# Patient Record
Sex: Female | Born: 1965 | Race: White | Hispanic: No | Marital: Single | Smoking: Former smoker
Health system: Southern US, Community
[De-identification: ages and names within clinical notes are randomized; demographics above are authoritative.]

## PROBLEM LIST (undated history)

## (undated) DIAGNOSIS — Z8659 Personal history of other mental and behavioral disorders: Secondary | ICD-10-CM

## (undated) DIAGNOSIS — Z789 Other specified health status: Secondary | ICD-10-CM

## (undated) DIAGNOSIS — R059 Cough, unspecified: Secondary | ICD-10-CM

## (undated) DIAGNOSIS — M79672 Pain in left foot: Secondary | ICD-10-CM

## (undated) DIAGNOSIS — K219 Gastro-esophageal reflux disease without esophagitis: Secondary | ICD-10-CM

## (undated) DIAGNOSIS — F419 Anxiety disorder, unspecified: Secondary | ICD-10-CM

## (undated) DIAGNOSIS — R05 Cough: Secondary | ICD-10-CM

## (undated) DIAGNOSIS — R0600 Dyspnea, unspecified: Secondary | ICD-10-CM

## (undated) DIAGNOSIS — K2100 Gastro-esophageal reflux disease with esophagitis, without bleeding: Secondary | ICD-10-CM

## (undated) DIAGNOSIS — J45909 Unspecified asthma, uncomplicated: Secondary | ICD-10-CM

## (undated) DIAGNOSIS — L309 Dermatitis, unspecified: Secondary | ICD-10-CM

## (undated) DIAGNOSIS — R0689 Other abnormalities of breathing: Secondary | ICD-10-CM

## (undated) DIAGNOSIS — Z8719 Personal history of other diseases of the digestive system: Secondary | ICD-10-CM

## (undated) DIAGNOSIS — I499 Cardiac arrhythmia, unspecified: Secondary | ICD-10-CM

## (undated) DIAGNOSIS — E559 Vitamin D deficiency, unspecified: Secondary | ICD-10-CM

## (undated) DIAGNOSIS — M1990 Unspecified osteoarthritis, unspecified site: Secondary | ICD-10-CM

## (undated) DIAGNOSIS — R12 Heartburn: Secondary | ICD-10-CM

## (undated) DIAGNOSIS — Z889 Allergy status to unspecified drugs, medicaments and biological substances status: Secondary | ICD-10-CM

## (undated) DIAGNOSIS — E039 Hypothyroidism, unspecified: Secondary | ICD-10-CM

## (undated) HISTORY — PX: ABLATION: SHX5711

## (undated) HISTORY — PX: BACK SURGERY: SHX140

## (undated) HISTORY — PX: TONSILLECTOMY: SUR1361

## (undated) HISTORY — PX: OTHER SURGICAL HISTORY: SHX169

## (undated) HISTORY — PX: TUBAL LIGATION: SHX77

---

## 2012-09-04 ENCOUNTER — Emergency Department: Payer: Self-pay | Admitting: Emergency Medicine

## 2012-09-04 LAB — URINALYSIS, COMPLETE
Bilirubin,UR: NEGATIVE
Glucose,UR: NEGATIVE mg/dL (ref 0–75)
Leukocyte Esterase: NEGATIVE
Nitrite: NEGATIVE
Ph: 5 (ref 4.5–8.0)
Protein: NEGATIVE

## 2012-09-30 ENCOUNTER — Emergency Department: Payer: Self-pay | Admitting: Emergency Medicine

## 2012-09-30 LAB — URINALYSIS, COMPLETE
Bilirubin,UR: NEGATIVE
Glucose,UR: NEGATIVE mg/dL (ref 0–75)
Ketone: NEGATIVE
Leukocyte Esterase: NEGATIVE
Ph: 6 (ref 4.5–8.0)
Protein: NEGATIVE
RBC,UR: <1 /HPF (ref 0–5)
Specific Gravity: 1.011 (ref 1.003–1.030)

## 2012-09-30 LAB — CBC
HCT: 37.3 % (ref 35.0–47.0)
HGB: 12.5 g/dL (ref 12.0–16.0)
MCH: 28.8 pg (ref 26.0–34.0)
MCHC: 33.6 g/dL (ref 32.0–36.0)
MCV: 86 fL (ref 80–100)
RBC: 4.35 10*6/uL (ref 3.80–5.20)
WBC: 7.8 10*3/uL (ref 3.6–11.0)

## 2012-09-30 LAB — COMPREHENSIVE METABOLIC PANEL
Albumin: 3.5 g/dL (ref 3.4–5.0)
Anion Gap: 9 (ref 7–16)
BUN: 8 mg/dL (ref 7–18)
Calcium, Total: 8.8 mg/dL (ref 8.5–10.1)
Chloride: 108 mmol/L — ABNORMAL HIGH (ref 98–107)
Co2: 23 mmol/L (ref 21–32)
EGFR (African American): >60
EGFR (Non-African Amer.): >60
Glucose: 89 mg/dL (ref 65–99)
Osmolality: 277 (ref 275–301)
Potassium: 4 mmol/L (ref 3.5–5.1)
SGOT(AST): 16 U/L (ref 15–37)
Sodium: 140 mmol/L (ref 136–145)
Total Protein: 7.5 g/dL (ref 6.4–8.2)

## 2012-09-30 LAB — LIPASE, BLOOD: Lipase: 68 U/L — ABNORMAL LOW (ref 73–393)

## 2013-05-02 ENCOUNTER — Emergency Department: Payer: Self-pay | Admitting: Emergency Medicine

## 2013-05-02 LAB — URINALYSIS, COMPLETE
Bacteria: NONE SEEN
Blood: NEGATIVE
Ketone: NEGATIVE
Ph: 5 (ref 4.5–8.0)
Specific Gravity: 1.026 (ref 1.003–1.030)

## 2013-05-02 LAB — COMPREHENSIVE METABOLIC PANEL
Albumin: 3.4 g/dL (ref 3.4–5.0)
Alkaline Phosphatase: 75 U/L (ref 50–136)
Anion Gap: 5 — ABNORMAL LOW (ref 7–16)
BUN: 9 mg/dL (ref 7–18)
Bilirubin,Total: 0.3 mg/dL (ref 0.2–1.0)
Chloride: 108 mmol/L — ABNORMAL HIGH (ref 98–107)
Co2: 27 mmol/L (ref 21–32)
EGFR (Non-African Amer.): >60
Osmolality: 278 (ref 275–301)
SGPT (ALT): 17 U/L (ref 12–78)
Sodium: 140 mmol/L (ref 136–145)
Total Protein: 7.3 g/dL (ref 6.4–8.2)

## 2013-05-02 LAB — CBC
HGB: 13.2 g/dL (ref 12.0–16.0)
MCHC: 34 g/dL (ref 32.0–36.0)
MCV: 84 fL (ref 80–100)
RBC: 4.6 10*6/uL (ref 3.80–5.20)
WBC: 9.4 10*3/uL (ref 3.6–11.0)

## 2013-05-02 LAB — LIPASE, BLOOD: Lipase: 82 U/L (ref 73–393)

## 2013-05-02 LAB — WET PREP, GENITAL

## 2013-05-03 LAB — GC/CHLAMYDIA PROBE AMP

## 2013-11-09 IMAGING — CT CT ABD-PELV W/O CM
1 of 2 series · 14 of 32 positions shown, 18 images · non-contrast
Comparison: none

REASON FOR EXAM: RUQ/RLQ pain, nausea
COMMENTS:

[Series 2: 3mm soft tissue · axial · 0.89mm/px · z∈[-1066,-614]mm · 14 of 167 slices shown, 18 images]
[im 8/167  soft-tissue]
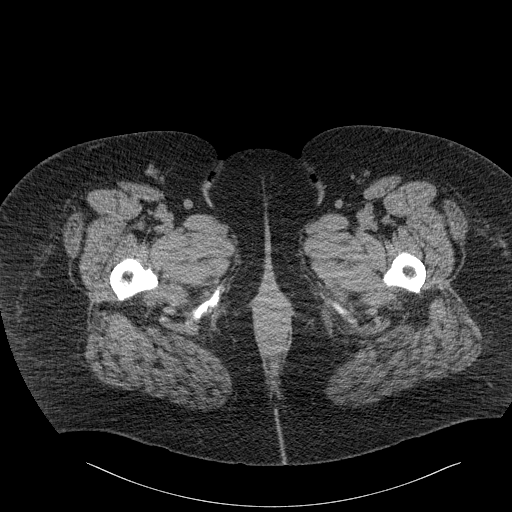
[im 8/167  bone]
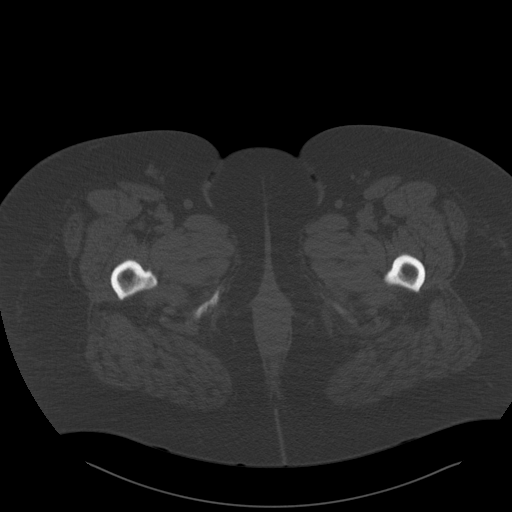
[im 22/167  soft-tissue]
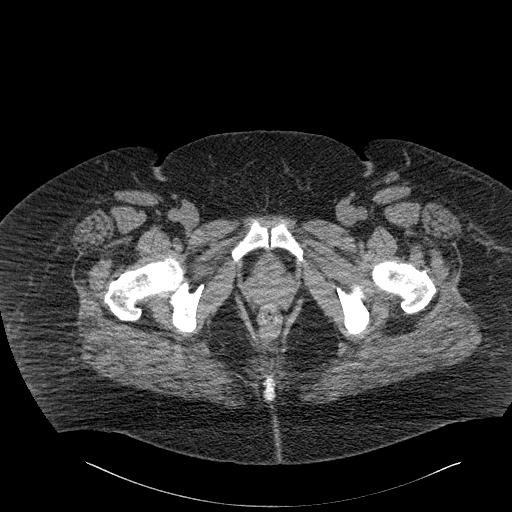
[im 37/167  soft-tissue]
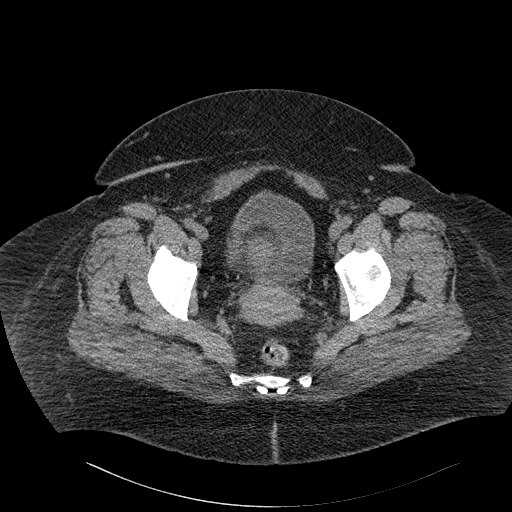
[im 51/167  soft-tissue]
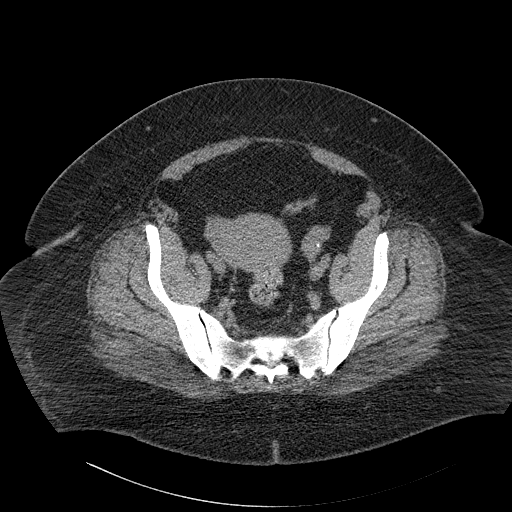
[im 65/167  soft-tissue]
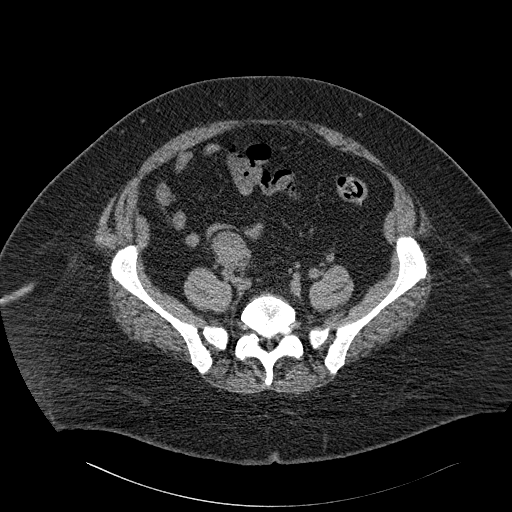
[im 80/167  soft-tissue]
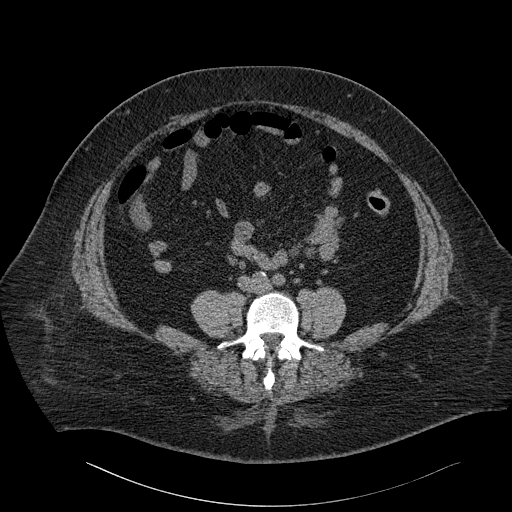
[im 87/167  soft-tissue]
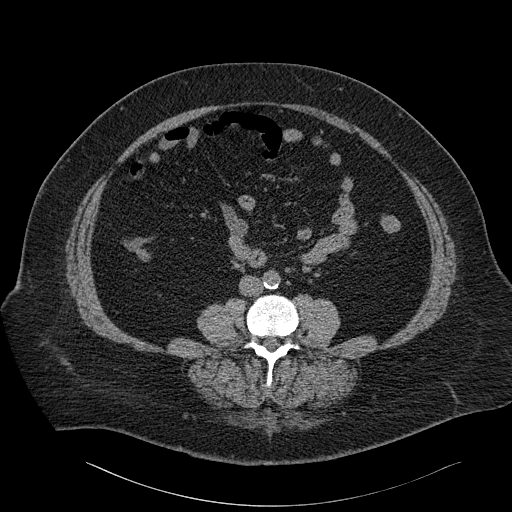
[im 102/167  soft-tissue]
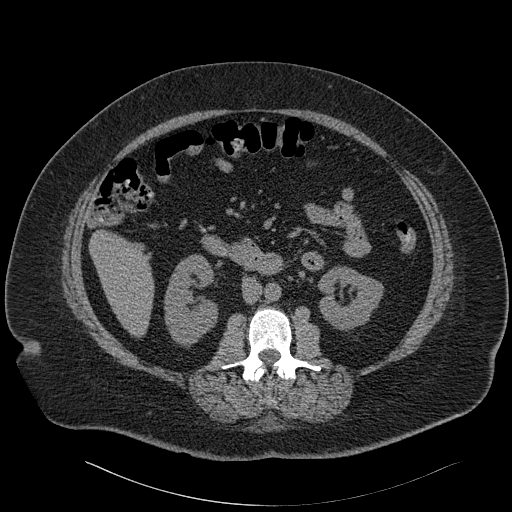
[im 116/167  soft-tissue]
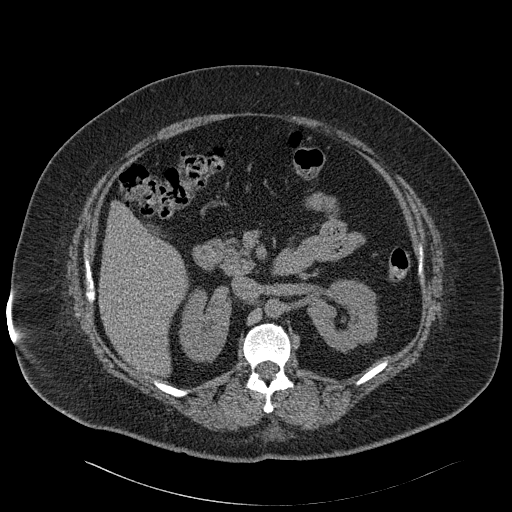
[im 116/167  bone]
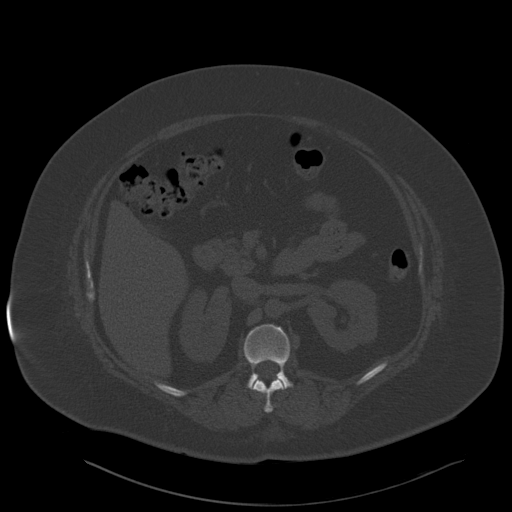
[im 130/167  soft-tissue]
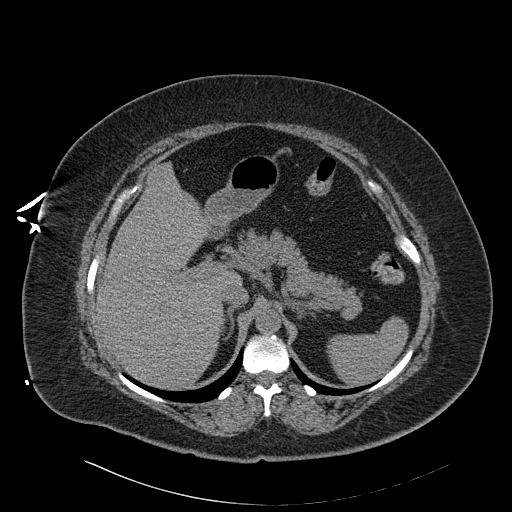
[im 138/167  lung]
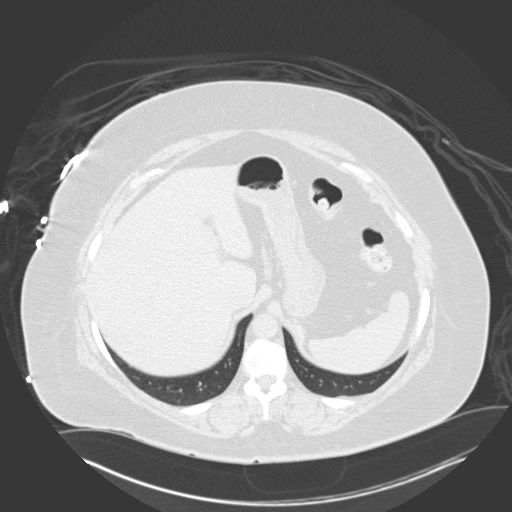
[im 145/167  soft-tissue]
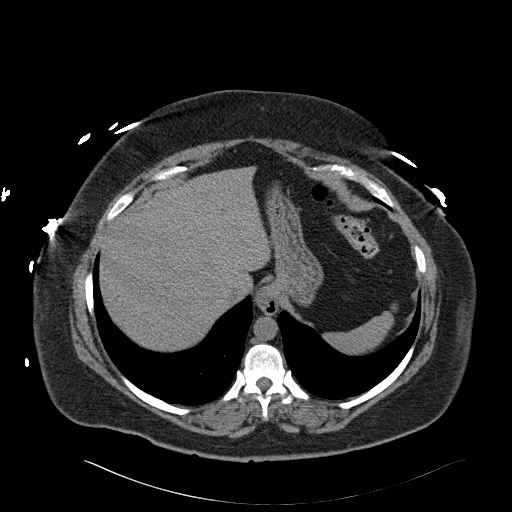
[im 145/167  lung]
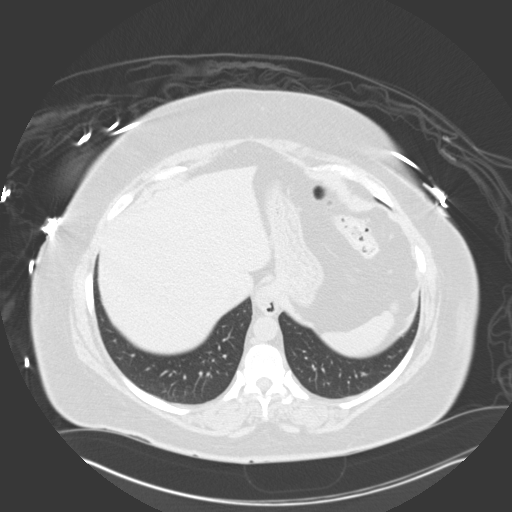
[im 152/167  lung]
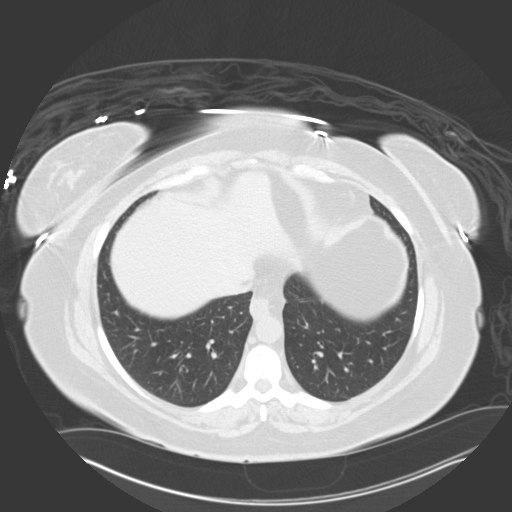
[im 159/167  soft-tissue]
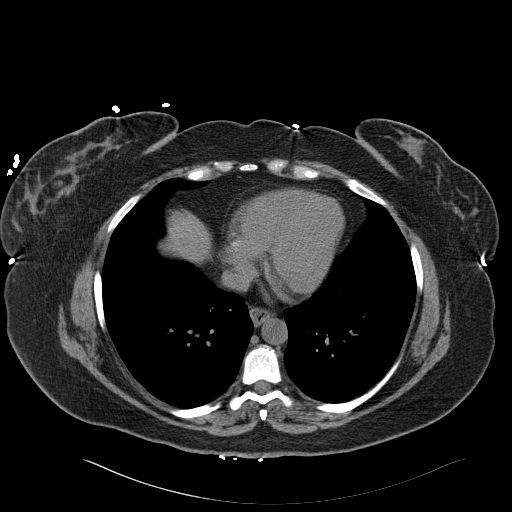
[im 159/167  lung]
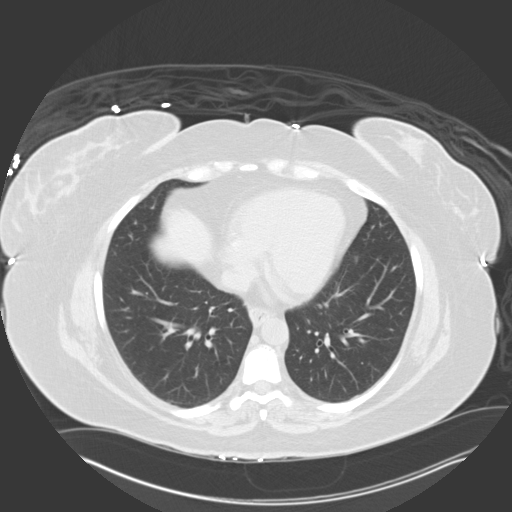

[14 of 32 positions shown; findings below may reference images not displayed]

PROCEDURE:     CT  - CT ABDOMEN AND PELVIS W[DATE]  [DATE]

RESULT:     Axial noncontrast CT scanning was performed through the abdomen
and pelvis with reconstructions at 3 mm intervals and slice thicknesses.
Review of multiplanar reconstructed images was performed separately on the
VIA monitor.

The liver exhibits no focal mass nor ductal dilation. The gallbladder is
adequately distended with no evidence of stones or surrounding inflammatory
change. The pancreas, nondistended stomach, spleen, adrenal glands, and
kidneys exhibit no evident acute abnormalities. There is no evidence of
obstruction of either kidney; no calcified stones are demonstrated. The
caliber of the abdominal aorta is normal.

The unopacified loops of small and large bowel are normal in appearance. The
appendix is not discretely demonstrated. There is no pericecal inflammatory
change nor evidence of acute inflammation elsewhere in the right upper or
lower aspects of the abdomen.

Within the pelvis the uterus is grossly normal. There is soft tissue
fullness in the right adnexal region which measures 5.1 cm transversely by
3.6 cm AP by 4.3 cm in superior-inferior dimension. This structure is
closely applied to the the uterine fundus on the right. It is hypodense with
respect to the density of the uterus however.

There is no evidence of ascites. The psoas musculature appears normal. The
urinary bladder exhibits no acute abnormality. There is no umbilical or
inguinal hernia. The lumbar vertebral bodies are preserved in height. There
is disc space narrowing at L4-L5 and L5-S1. The lung bases are clear.
IMPRESSION: 1. There is no evidence of obstruction of either kidney.
2. There are no findings to suggest acute bowel inflammation. The appendix
is not discretely identified, but no inflammatory process in the right upper
or lower quadrant of the abdomen is demonstrated. Evaluation the bowel is
limited without oral contrast.
3. There is soft tissue fullness in the right adnexal region which merits
further evaluation with gynecological exam and pelvic ultrasound. I do not
see definite acute abnormality of the uterus or left adnexal structures.
4. There is no evidence of ascites. No bulky intra-abdominal nor pelvic
lymphadenopathy is demonstrated.

[REDACTED]

## 2013-11-09 IMAGING — US ABDOMEN ULTRASOUND LIMITED
1 series · 14 of 25 positions shown · non-contrast
Comparison: none

REASON FOR EXAM: RUQ pain and nausea
COMMENTS:   Body Site: GB and Fossa, CBD, Head of Pancreas

[Series 1: abdomen ultrasound limited · 0.31mm/px · 14 of 42 slices shown]
[im 1/42]
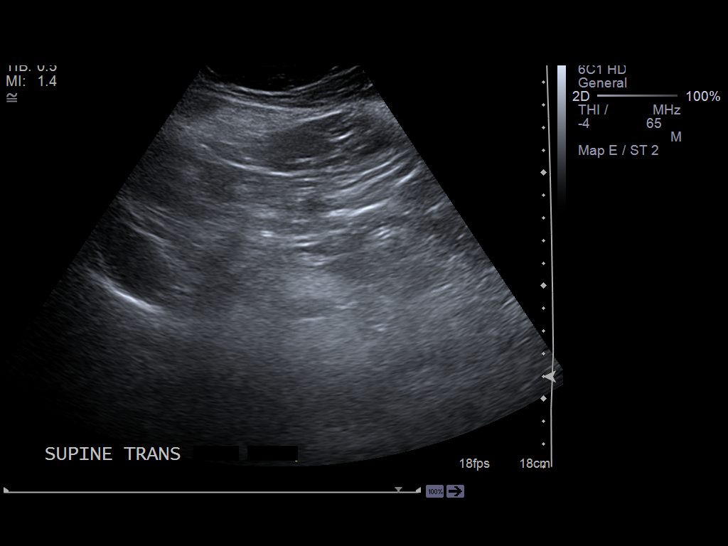
[im 4/42]
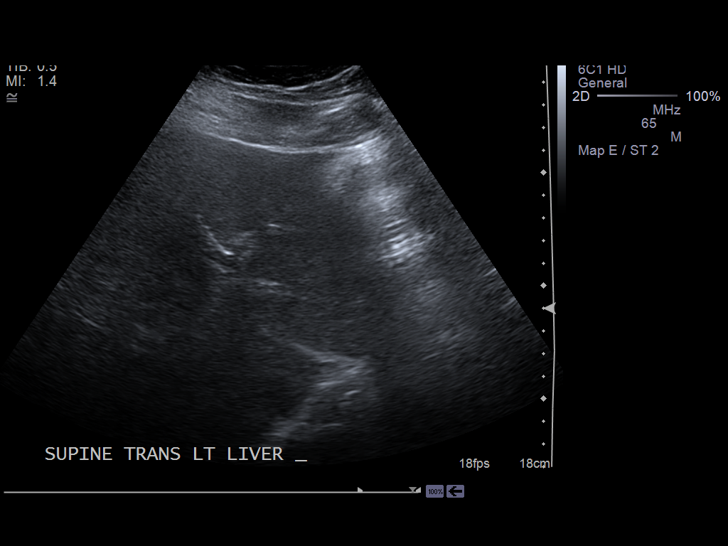
[im 7/42]
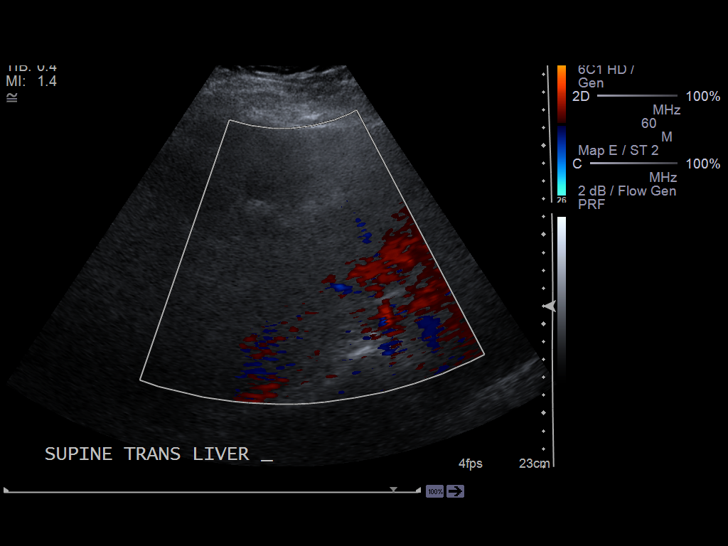
[im 11/42]
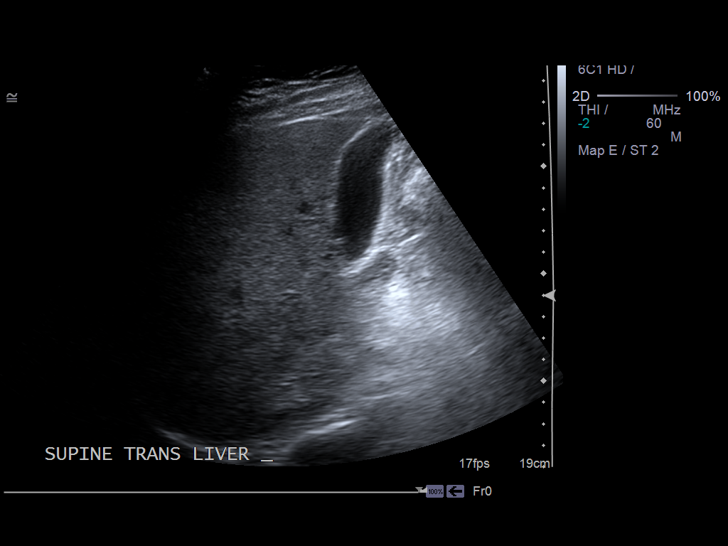
[im 14/42]
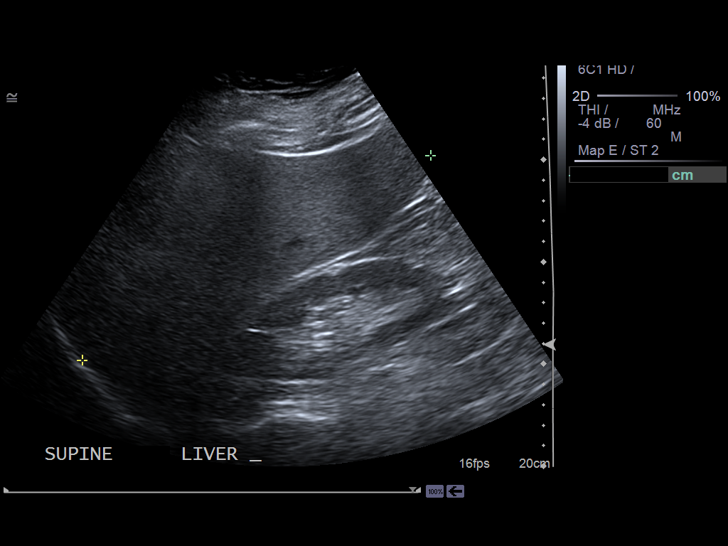
[im 16/42]
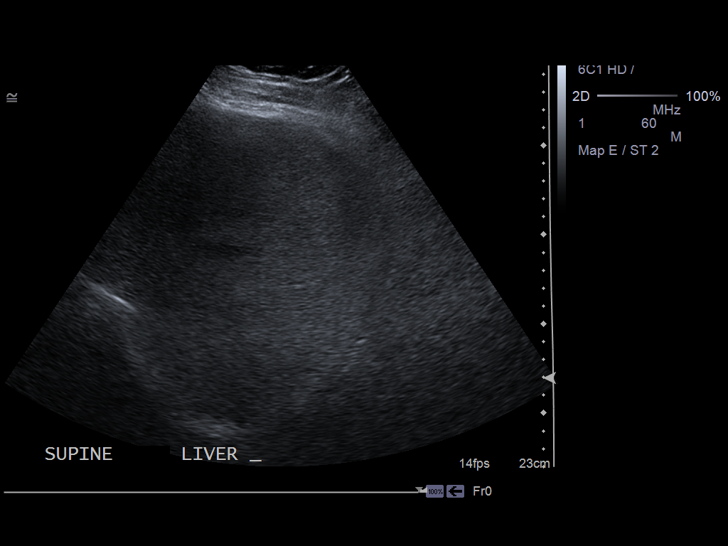
[im 19/42]
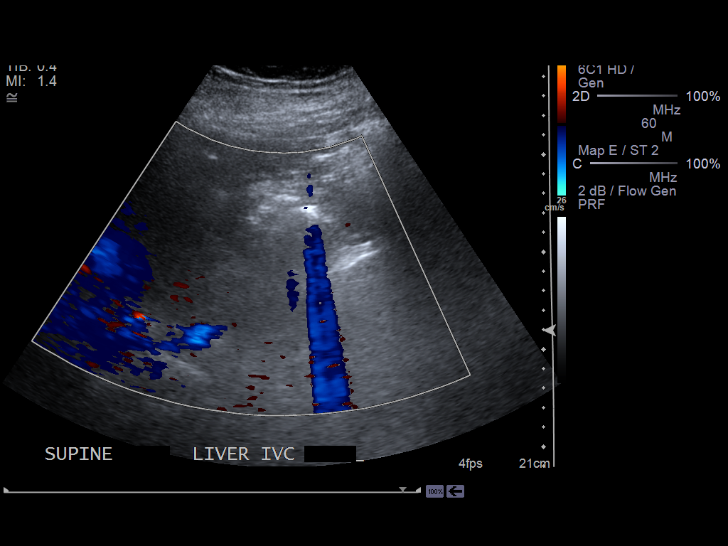
[im 23/42]
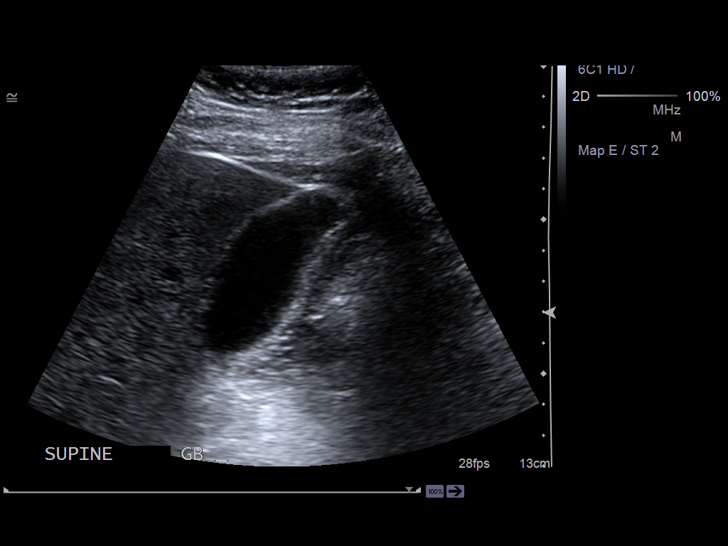
[im 26/42]
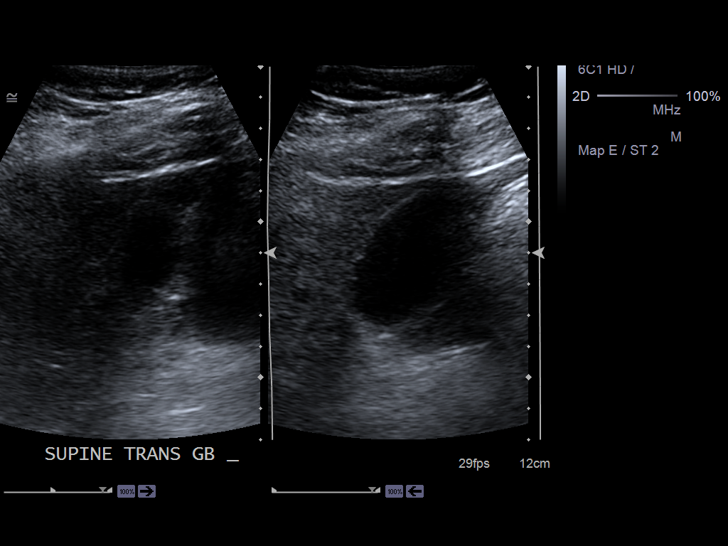
[im 28/42]
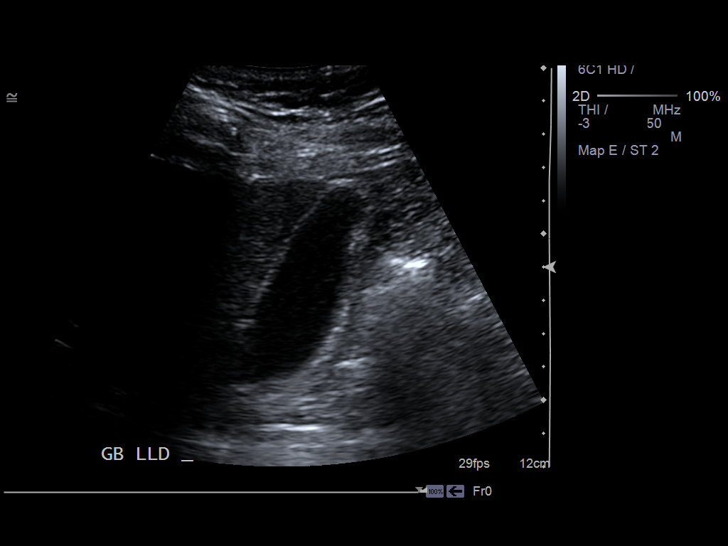
[im 31/42]
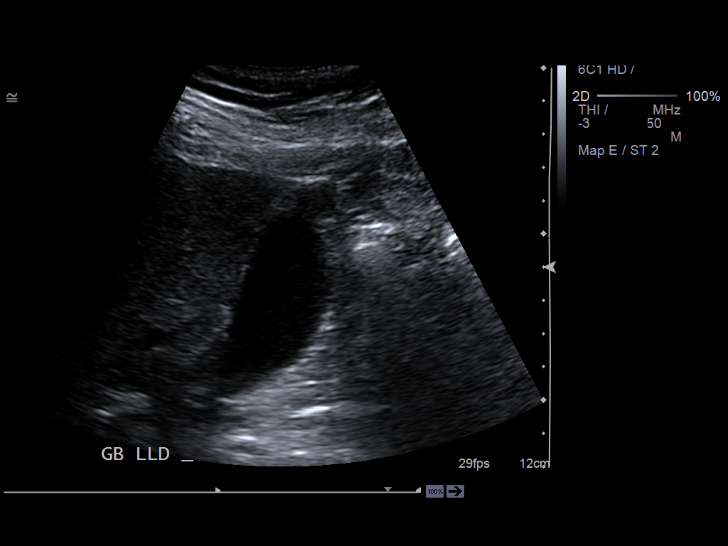
[im 35/42]
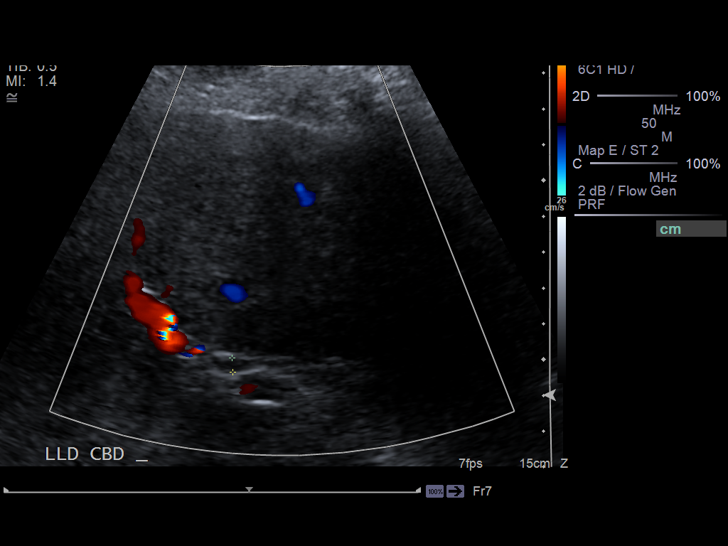
[im 38/42]
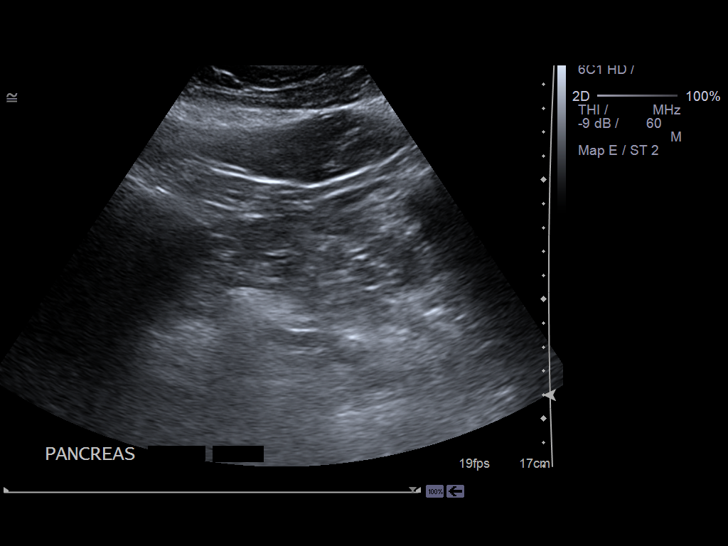
[im 42/42]
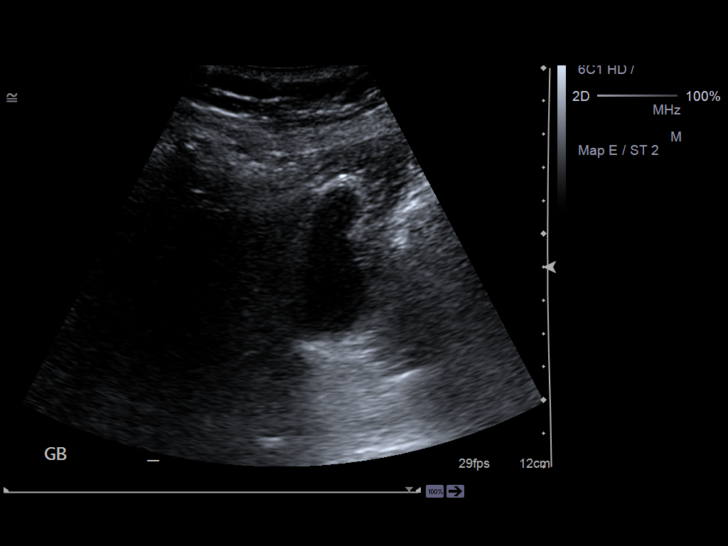

[14 of 25 positions shown; findings below may reference images not displayed]

PROCEDURE:     US  - US ABDOMEN LIMITED SURVEY  - September 30, 2012 [DATE]

RESULT:     Limited right upper quadrant abdominal sonogram is performed.
There is increased echotexture of the liver consistent with diffuse fatty
infiltration. The pancreas is not visualized. The gallbladder is normal in
appearance without stones. There is a negative sonographic Murphy's sign.
Gallbladder wall measures 2.4 mm in thickness. The common bile duct diameter
is normal at 4.2 mm. The portal venous flow is normal.
IMPRESSION: 1. Fatty infiltration of the liver.
2. Nonvisualization of the pancreas.

[REDACTED]

## 2014-04-21 ENCOUNTER — Ambulatory Visit: Payer: Self-pay | Admitting: Family Medicine

## 2014-05-07 ENCOUNTER — Ambulatory Visit: Payer: Self-pay | Admitting: Family Medicine

## 2014-08-02 ENCOUNTER — Ambulatory Visit: Payer: Self-pay | Admitting: Family Medicine

## 2016-02-09 ENCOUNTER — Ambulatory Visit (INDEPENDENT_AMBULATORY_CARE_PROVIDER_SITE_OTHER): Payer: BC Managed Care – PPO

## 2016-02-09 ENCOUNTER — Ambulatory Visit
Admission: EM | Admit: 2016-02-09 | Discharge: 2016-02-09 | Disposition: A | Discharge status: Home / Self Care | Payer: BC Managed Care – PPO | Attending: Family Medicine | Admitting: Family Medicine

## 2016-02-09 DIAGNOSIS — J209 Acute bronchitis, unspecified: Secondary | ICD-10-CM | POA: Diagnosis not present

## 2016-02-09 DIAGNOSIS — J111 Influenza due to unidentified influenza virus with other respiratory manifestations: Secondary | ICD-10-CM

## 2016-02-09 DIAGNOSIS — J9811 Atelectasis: Secondary | ICD-10-CM | POA: Diagnosis not present

## 2016-02-09 HISTORY — DX: Heartburn: R12

## 2016-02-09 LAB — RAPID INFLUENZA A&B ANTIGENS: Influenza A (ARMC): POSITIVE — AB

## 2016-02-09 LAB — RAPID INFLUENZA A&B ANTIGENS (ARMC ONLY): INFLUENZA B (ARMC): NEGATIVE

## 2016-02-09 MED ORDER — FEXOFENADINE-PSEUDOEPHED ER 180-240 MG PO TB24: 1.0000 | ORAL_TABLET | Freq: Every day | ORAL | Status: DC

## 2016-02-09 MED ORDER — IPRATROPIUM-ALBUTEROL 0.5-2.5 (3) MG/3ML IN SOLN
3.0000 mL | Freq: Once | RESPIRATORY_TRACT | Status: AC
  Administered 2016-02-09: 3 mL via RESPIRATORY_TRACT

## 2016-02-09 MED ORDER — ALBUTEROL SULFATE HFA 108 (90 BASE) MCG/ACT IN AERS: 2.0000 | INHALATION_SPRAY | RESPIRATORY_TRACT | Status: AC | PRN

## 2016-02-09 MED ORDER — HYDROCOD POLST-CPM POLST ER 10-8 MG/5ML PO SUER: 5.0000 mL | Freq: Two times a day (BID) | ORAL | Status: DC | PRN

## 2016-02-09 MED ORDER — OSELTAMIVIR PHOSPHATE 75 MG PO CAPS: 75.0000 mg | ORAL_CAPSULE | Freq: Two times a day (BID) | ORAL | Status: DC

## 2016-02-09 MED ORDER — LEVOFLOXACIN 500 MG PO TABS: 500.0000 mg | ORAL_TABLET | Freq: Every day | ORAL | Status: DC

## 2016-02-09 NOTE — Discharge Instructions (Signed)
Acute Bronchitis °Bronchitis is when the airways that extend from the windpipe into the lungs get red, puffy, and painful (inflamed). Bronchitis often causes thick spit (mucus) to develop. This leads to a cough. A cough is the most common symptom of bronchitis. °In acute bronchitis, the condition usually begins suddenly and goes away over time (usually in 2 weeks). Smoking, allergies, and asthma can make bronchitis worse. Repeated episodes of bronchitis may cause more lung problems. °HOME CARE °· Rest. °· Drink enough fluids to keep your pee (urine) clear or pale yellow (unless you need to limit fluids as told by your doctor). °· Only take over-the-counter or prescription medicines as told by your doctor. °· Avoid smoking and secondhand smoke. These can make bronchitis worse. If you are a smoker, think about using nicotine gum or skin patches. Quitting smoking will help your lungs heal faster. °· Reduce the chance of getting bronchitis again by: °¨ Washing your hands often. °¨ Avoiding people with cold symptoms. °¨ Trying not to touch your hands to your mouth, nose, or eyes. °· Follow up with your doctor as told. °GET HELP IF: °Your symptoms do not improve after 1 week of treatment. Symptoms include: °· Cough. °· Fever. °· Coughing up thick spit. °· Body aches. °· Chest congestion. °· Chills. °· Shortness of breath. °· Sore throat. °GET HELP RIGHT AWAY IF:  °· You have an increased fever. °· You have chills. °· You have severe shortness of breath. °· You have bloody thick spit (sputum). °· You throw up (vomit) often. °· You lose too much body fluid (dehydration). °· You have a severe headache. °· You faint. °MAKE SURE YOU:  °· Understand these instructions. °· Will watch your condition. °· Will get help right away if you are not doing well or get worse. °  °This information is not intended to replace advice given to you by your health care provider. Make sure you discuss any questions you have with your health care  provider. °  °Document Released: 04/30/2008 Document Revised: 07/15/2013 Document Reviewed: 05/05/2013 °Elsevier Interactive Patient Education ©2016 [employer] ° °Influenza, Adult °Influenza (flu) is an infection in the mouth, nose, and throat (respiratory tract) caused by a virus. The flu can make you feel very ill. Influenza spreads easily from person to person (contagious).  °HOME CARE  °· Only take medicines as told by your doctor. °· Use a cool mist humidifier to make breathing easier. °· Get plenty of rest until your fever goes away. This usually takes 3 to 4 days. °· Drink enough fluids to keep your pee (urine) clear or pale yellow. °· Cover your mouth and nose when you cough or sneeze. °· Wash your hands well to avoid spreading the flu. °· Stay home from work or school until your fever has been gone for at least 1 full day. °· Get a flu shot every year. °GET HELP RIGHT AWAY IF:  °· You have trouble breathing or feel short of breath. °· Your skin or nails turn blue. °· You have severe neck pain or stiffness. °· You have a severe headache, facial pain, or earache. °· Your fever gets worse or keeps coming back. °· You feel sick to your stomach (nauseous), throw up (vomit), or have watery poop (diarrhea). °· You have chest pain. °· You have a deep cough that gets worse, or you cough up more thick spit (mucus). °MAKE SURE YOU:  °· Understand these instructions. °· Will watch your condition. °· Will get   help right away if you are not doing well or get worse. °  °This information is not intended to replace advice given to you by your health care provider. Make sure you discuss any questions you have with your health care provider. °  °Document Released: 08/21/2008 Document Revised: 12/03/2014 Document Reviewed: 02/11/2012 °Elsevier Interactive Patient Education ©2016 [employer] ° °Upper Respiratory Infection, Adult °Most upper respiratory infections (URIs) are caused by a virus. A URI affects the nose,  throat, and upper air passages. The most common type of URI is often called "the common cold." °HOME CARE  °· Take medicines only as told by your doctor. °· Gargle warm saltwater or take cough drops to comfort your throat as told by your doctor. °· Use a warm mist humidifier or inhale steam from a shower to increase air moisture. This may make it easier to breathe. °· Drink enough fluid to keep your pee (urine) clear or pale yellow. °· Eat soups and other clear broths. °· Have a healthy diet. °· Rest as needed. °· Go back to work when your fever is gone or your doctor says it is okay. °¨ You may need to stay home longer to avoid giving your URI to others. °¨ You can also wear a face mask and wash your hands often to prevent spread of the virus. °· Use your inhaler more if you have asthma. °· Do not use any tobacco products, including cigarettes, chewing tobacco, or electronic cigarettes. If you need help quitting, ask your doctor. °GET HELP IF: °· You are getting worse, not better. °· Your symptoms are not helped by medicine. °· You have chills. °· You are getting more short of breath. °· You have brown or red mucus. °· You have yellow or brown discharge from your nose. °· You have pain in your face, especially when you bend forward. °· You have a fever. °· You have puffy (swollen) neck glands. °· You have pain while swallowing. °· You have white areas in the back of your throat. °GET HELP RIGHT AWAY IF:  °· You have very bad or constant: °¨ Headache. °¨ Ear pain. °¨ Pain in your forehead, behind your eyes, and over your cheekbones (sinus pain). °¨ Chest pain. °· You have long-lasting (chronic) lung disease and any of the following: °¨ Wheezing. °¨ Long-lasting cough. °¨ Coughing up blood. °¨ A change in your usual mucus. °· You have a stiff neck. °· You have changes in your: °¨ Vision. °¨ Hearing. °¨ Thinking. °¨ Mood. °MAKE SURE YOU:  °· Understand these instructions. °· Will watch your condition. °· Will get help  right away if you are not doing well or get worse. °  °This information is not intended to replace advice given to you by your health care provider. Make sure you discuss any questions you have with your health care provider. °  °Document Released: 04/30/2008 Document Revised: 03/29/2015 Document Reviewed: 02/17/2014 °Elsevier Interactive Patient Education ©2016 [employer]. ° °

## 2016-02-09 NOTE — ED Notes (Signed)
Patient c/o sneezing, coughing, body aches, chest pain, head congestion, and runny nose which started Saturday evening.

## 2016-02-09 NOTE — ED Provider Notes (Addendum)
CSN: [id number]     Arrival date & time 02/09/16  1350 History   First MD Initiated Contact with Patient 02/09/16 1557    Nurses notes were reviewed. Chief Complaint  Patient presents with  . URI  Patient is here because of symptoms of flu. She states everything started on Saturday when she was at amusement park in [place]. She states that she start on down with something Saturday after being at the park all day but she just thought she was just tired and fatigued and she states that the tiredness and fatigue persisted on Sunday and Monday she started noticing sore throat nasal congestion coughing and general malaise getting worse. They drove home from [place] yesterday on Wednesday she was unable to drive because of fever myalgia and is feeling worse. She's here today now feeling real bad no fever but she states that she has been coughing more she's having shortness of breath and she still is wheezing as well. She does have a history of asthma she is a former smoker but she doesn't smoke now. She states that those not really sore but she does feel miserable.       (Consider location/radiation/quality/duration/timing/severity/associated sxs/prior Treatment) Patient is a 50 y.o. female presenting with URI. The history is provided by the patient. No language interpreter was used.  URI Presenting symptoms: congestion, cough, facial pain, fatigue, fever and rhinorrhea   Presenting symptoms: no ear pain and no sore throat   Severity:  Moderate Timing:  Constant Progression:  Worsening Chronicity:  New Relieved by:  Nothing Worsened by:  Certain positions Ineffective treatments:  OTC medications Associated symptoms: myalgias, sneezing, swollen glands and wheezing   Risk factors: not elderly, no chronic cardiac disease and no chronic kidney disease     Past Medical History  Diagnosis Date  . Heartburn    Past Surgical History  Procedure Laterality Date  . Cesarean section    .  Tonsillectomy    . Back surgery    . Right arthroscopy     . Ablation     History reviewed. No pertinent family history. Social History  Substance Use Topics  . Smoking status: Former Games developer  . Smokeless tobacco: None  . Alcohol Use: Yes   OB History    No data available     Review of Systems  Constitutional: Positive for fever and fatigue.  HENT: Positive for congestion, rhinorrhea and sneezing. Negative for ear pain and sore throat.   Respiratory: Positive for cough and wheezing.   Cardiovascular: Positive for chest pain.  Musculoskeletal: Positive for myalgias.  All other systems reviewed and are negative.   Allergies  Fish allergy; Latex; and Sulfa antibiotics  Home Medications   Prior to Admission medications   Medication Sig Start Date End Date Taking? Authorizing Provider  Cholecalciferol (VITAMIN D PO) Take by mouth.   Yes Historical Provider, MD  esomeprazole (NEXIUM) 40 MG capsule Take 40 mg by mouth daily at 12 noon.   Yes Historical Provider, MD  levothyroxine (SYNTHROID, LEVOTHROID) 25 MCG tablet Take 25 mcg by mouth daily before breakfast.   Yes Historical Provider, MD  chlorpheniramine-HYDROcodone (TUSSIONEX PENNKINETIC ER) 10-8 MG/5ML SUER Take 5 mLs by mouth every 12 (twelve) hours as needed for cough. 02/09/16   Hassan Rowan, MD  fexofenadine-pseudoephedrine (ALLEGRA-D ALLERGY & CONGESTION) 180-240 MG 24 hr tablet Take 1 tablet by mouth daily. 02/09/16   Hassan Rowan, MD  levofloxacin (LEVAQUIN) 500 MG tablet Take 1 tablet (500 mg total) by  mouth daily. 02/09/16   Hassan Rowan, MD  oseltamivir (TAMIFLU) 75 MG capsule Take 1 capsule (75 mg total) by mouth 2 (two) times daily. 02/09/16   Hassan Rowan, MD   Meds Ordered and Administered this Visit   Medications  ipratropium-albuterol (DUONEB) 0.5-2.5 (3) MG/3ML nebulizer solution 3 mL (3 mLs Nebulization Given 02/09/16 1631)    BP 116/70 mmHg  Pulse 79  Temp(Src) 97.7 F (36.5 C) (Oral)  Resp 18  Ht   (1.676 m)  Wt 300 lb (136.079 kg)  BMI 48.44 kg/m2  SpO2 100% No data found.   Physical Exam  Constitutional: She is oriented to person, place, and time. She appears well-developed and well-nourished. She has a sickly appearance. She appears ill. No distress.  HENT:  Head: Normocephalic and atraumatic.  Right Ear: External ear normal.  Left Ear: External ear normal.  Mouth/Throat: Oropharynx is clear and moist.  Eyes: Conjunctivae are normal. Pupils are equal, round, and reactive to light.  Neck: Normal range of motion. Neck supple.  Cardiovascular: Normal rate, regular rhythm and normal heart sounds.   Pulmonary/Chest: Effort normal. No respiratory distress. She has wheezes in the right upper field, the right middle field and the right lower field.  Musculoskeletal: Normal range of motion. She exhibits no edema.  Lymphadenopathy:    She has cervical adenopathy.  Neurological: She is alert and oriented to person, place, and time. No cranial nerve deficit.  Skin: Skin is warm and dry.  Psychiatric: She has a normal mood and affect.  Vitals reviewed.   ED Course  Procedures (including critical care time)  Labs Review Labs Reviewed  RAPID INFLUENZA A&B ANTIGENS ([facility] ONLY) - Abnormal; Notable for the following:    Influenza A [facility]) POSITIVE (*)    All other components within normal limits    Imaging Review Dg Chest 2 View  02/09/2016  CLINICAL DATA:  Dry cough for few days EXAM: CHEST  2 VIEW COMPARISON:  None. FINDINGS: Cardiac shadow is within normal limits. The lungs are well aerated bilaterally. Mild atelectasis is noted in the medial left lung base. No sizable effusion is seen. No bony abnormality is noted. IMPRESSION: Mild left basilar atelectasis. Electronically Signed   By: Alcide Clever M.D.   On: 02/09/2016 16:59     Visual Acuity Review  Right Eye Distance:   Left Eye Distance:   Bilateral Distance:    Right Eye Near:   Left Eye Near:    Bilateral Near:      Results for orders placed or performed during the hospital encounter of 02/09/16  Rapid Influenza A&B Antigens ([facility] only)  Result Value Ref Range   Influenza A ([facility]) POSITIVE (A) NEGATIVE   Influenza B ([facility]) NEGATIVE NEGATIVE      MDM   1. Flu   2. Atelectasis, left   3. Bronchospasm with bronchitis, acute     Patient has the flu bronchospasms which were very intense in the right she's got left lower lobe atelectasis as well. Have to be concerned she is developing early pneumonia we'll treat her even though is probably more than 72 hours with Tamiflu just because of exacerbation and persistence of her condition. Tussionex 1 teaspoon twice a day. Levaquin 500 once a day and Allegra-D 24 hours 1 tablet daily. Will give a work note for today and tomorrow and let her go back to work on Sunday.  She was given a DuoNeb treatment here which she states did help with the bronchospasms  but did make her jittery and nervous.  Note: This dictation was prepared with Dragon dictation along with smaller phrase technology. Any transcriptional errors that result from this process are unintentional.    Hassan RowanEugene Anavictoria Wilk, MD 02/09/16 1737  Hassan RowanEugene Shenell Rogalski, MD 02/09/16 [phone]

## 2016-02-21 ENCOUNTER — Ambulatory Visit
Admission: RE | Admit: 2016-02-21 | Discharge: 2016-02-21 | Disposition: A | Discharge status: Home / Self Care | Payer: BC Managed Care – PPO | Source: Ambulatory Visit | Attending: Family Medicine | Admitting: Family Medicine

## 2016-02-21 ENCOUNTER — Other Ambulatory Visit: Payer: Self-pay | Admitting: Family Medicine

## 2016-02-21 DIAGNOSIS — R079 Chest pain, unspecified: Secondary | ICD-10-CM

## 2016-02-21 DIAGNOSIS — I2510 Atherosclerotic heart disease of native coronary artery without angina pectoris: Secondary | ICD-10-CM | POA: Insufficient documentation

## 2016-02-21 DIAGNOSIS — R0602 Shortness of breath: Secondary | ICD-10-CM

## 2016-02-21 HISTORY — DX: Unspecified asthma, uncomplicated: J45.909

## 2016-02-21 MED ORDER — IOPAMIDOL (ISOVUE-370) INJECTION 76%
100.0000 mL | Freq: Once | INTRAVENOUS | Status: AC | PRN
  Administered 2016-02-21: 100 mL via INTRAVENOUS

## 2016-05-10 ENCOUNTER — Encounter: Payer: Self-pay | Admitting: *Deleted

## 2016-05-10 ENCOUNTER — Ambulatory Visit
Admission: RE | Admit: 2016-05-10 | Discharge: 2016-05-10 | Disposition: A | Discharge status: Home / Self Care | Payer: BC Managed Care – PPO | Source: Ambulatory Visit | Attending: Internal Medicine | Admitting: Internal Medicine

## 2016-05-10 ENCOUNTER — Encounter
Admission: RE | Disposition: A | Discharge status: Home / Self Care | Payer: Self-pay | Source: Ambulatory Visit | Attending: Internal Medicine

## 2016-05-10 DIAGNOSIS — Z91048 Other nonmedicinal substance allergy status: Secondary | ICD-10-CM | POA: Insufficient documentation

## 2016-05-10 DIAGNOSIS — R079 Chest pain, unspecified: Secondary | ICD-10-CM | POA: Insufficient documentation

## 2016-05-10 DIAGNOSIS — Z882 Allergy status to sulfonamides status: Secondary | ICD-10-CM | POA: Diagnosis not present

## 2016-05-10 DIAGNOSIS — Z8349 Family history of other endocrine, nutritional and metabolic diseases: Secondary | ICD-10-CM | POA: Diagnosis not present

## 2016-05-10 DIAGNOSIS — K219 Gastro-esophageal reflux disease without esophagitis: Secondary | ICD-10-CM | POA: Insufficient documentation

## 2016-05-10 DIAGNOSIS — I200 Unstable angina: Secondary | ICD-10-CM | POA: Insufficient documentation

## 2016-05-10 DIAGNOSIS — Z91041 Radiographic dye allergy status: Secondary | ICD-10-CM | POA: Insufficient documentation

## 2016-05-10 DIAGNOSIS — Z91018 Allergy to other foods: Secondary | ICD-10-CM | POA: Insufficient documentation

## 2016-05-10 DIAGNOSIS — L309 Dermatitis, unspecified: Secondary | ICD-10-CM | POA: Insufficient documentation

## 2016-05-10 DIAGNOSIS — Z87891 Personal history of nicotine dependence: Secondary | ICD-10-CM | POA: Insufficient documentation

## 2016-05-10 DIAGNOSIS — Z818 Family history of other mental and behavioral disorders: Secondary | ICD-10-CM | POA: Diagnosis not present

## 2016-05-10 DIAGNOSIS — Z91040 Latex allergy status: Secondary | ICD-10-CM | POA: Diagnosis not present

## 2016-05-10 DIAGNOSIS — J45909 Unspecified asthma, uncomplicated: Secondary | ICD-10-CM | POA: Insufficient documentation

## 2016-05-10 DIAGNOSIS — Z6841 Body Mass Index (BMI) 40.0 and over, adult: Secondary | ICD-10-CM | POA: Insufficient documentation

## 2016-05-10 DIAGNOSIS — Z888 Allergy status to other drugs, medicaments and biological substances status: Secondary | ICD-10-CM | POA: Diagnosis not present

## 2016-05-10 DIAGNOSIS — R0602 Shortness of breath: Secondary | ICD-10-CM | POA: Insufficient documentation

## 2016-05-10 DIAGNOSIS — Z9889 Other specified postprocedural states: Secondary | ICD-10-CM | POA: Diagnosis not present

## 2016-05-10 DIAGNOSIS — Z9109 Other allergy status, other than to drugs and biological substances: Secondary | ICD-10-CM | POA: Insufficient documentation

## 2016-05-10 DIAGNOSIS — Z823 Family history of stroke: Secondary | ICD-10-CM | POA: Diagnosis not present

## 2016-05-10 DIAGNOSIS — E559 Vitamin D deficiency, unspecified: Secondary | ICD-10-CM | POA: Insufficient documentation

## 2016-05-10 DIAGNOSIS — M1990 Unspecified osteoarthritis, unspecified site: Secondary | ICD-10-CM | POA: Insufficient documentation

## 2016-05-10 DIAGNOSIS — R5383 Other fatigue: Secondary | ICD-10-CM | POA: Insufficient documentation

## 2016-05-10 DIAGNOSIS — Z79899 Other long term (current) drug therapy: Secondary | ICD-10-CM | POA: Diagnosis not present

## 2016-05-10 DIAGNOSIS — Z91013 Allergy to seafood: Secondary | ICD-10-CM | POA: Diagnosis not present

## 2016-05-10 DIAGNOSIS — F419 Anxiety disorder, unspecified: Secondary | ICD-10-CM | POA: Diagnosis not present

## 2016-05-10 DIAGNOSIS — E079 Disorder of thyroid, unspecified: Secondary | ICD-10-CM | POA: Diagnosis not present

## 2016-05-10 DIAGNOSIS — I209 Angina pectoris, unspecified: Secondary | ICD-10-CM | POA: Diagnosis present

## 2016-05-10 DIAGNOSIS — Z7951 Long term (current) use of inhaled steroids: Secondary | ICD-10-CM | POA: Insufficient documentation

## 2016-05-10 DIAGNOSIS — I2510 Atherosclerotic heart disease of native coronary artery without angina pectoris: Secondary | ICD-10-CM | POA: Diagnosis not present

## 2016-05-10 DIAGNOSIS — G4733 Obstructive sleep apnea (adult) (pediatric): Secondary | ICD-10-CM | POA: Diagnosis not present

## 2016-05-10 HISTORY — PX: CARDIAC CATHETERIZATION: SHX172

## 2016-05-10 SURGERY — RIGHT/LEFT HEART CATH AND CORONARY ANGIOGRAPHY
Anesthesia: Moderate Sedation

## 2016-05-10 SURGERY — RIGHT/LEFT HEART CATH AND CORONARY ANGIOGRAPHY
Anesthesia: Moderate Sedation | Laterality: Bilateral

## 2016-05-10 MED ORDER — FAMOTIDINE 20 MG PO TABS
ORAL_TABLET | ORAL | Status: AC
  Administered 2016-05-10: 20 mg
  Filled 2016-05-10: qty 1

## 2016-05-10 MED ORDER — FENTANYL CITRATE (PF) 100 MCG/2ML IJ SOLN
INTRAMUSCULAR | Status: AC
  Filled 2016-05-10: qty 2

## 2016-05-10 MED ORDER — FENTANYL CITRATE (PF) 100 MCG/2ML IJ SOLN
INTRAMUSCULAR | Status: DC | PRN
  Administered 2016-05-10: 50 ug via INTRAVENOUS

## 2016-05-10 MED ORDER — HEPARIN (PORCINE) IN NACL 2-0.9 UNIT/ML-% IJ SOLN
INTRAMUSCULAR | Status: AC
  Filled 2016-05-10: qty 500

## 2016-05-10 MED ORDER — METHYLPREDNISOLONE SODIUM SUCC 125 MG IJ SOLR
INTRAMUSCULAR | Status: AC
  Administered 2016-05-10: 125 mg
  Filled 2016-05-10: qty 2

## 2016-05-10 MED ORDER — SODIUM CHLORIDE 0.9% FLUSH
10.0000 mL | Freq: Once | INTRAVENOUS | Status: AC
  Administered 2016-05-10: 10 mL via INTRAVENOUS

## 2016-05-10 MED ORDER — MIDAZOLAM HCL 2 MG/2ML IJ SOLN
INTRAMUSCULAR | Status: DC | PRN
  Administered 2016-05-10 (×2): 1 mg via INTRAVENOUS

## 2016-05-10 MED ORDER — MIDAZOLAM HCL 2 MG/2ML IJ SOLN
INTRAMUSCULAR | Status: AC
  Filled 2016-05-10: qty 2

## 2016-05-10 MED ORDER — SODIUM CHLORIDE 0.9 % IV SOLN
INTRAVENOUS | Status: DC
  Administered 2016-05-10: 13:00:00 via INTRAVENOUS

## 2016-05-10 MED ORDER — SODIUM CHLORIDE 0.9 % IV SOLN: 250.0000 mL | INTRAVENOUS | Status: DC | PRN

## 2016-05-10 MED ORDER — BACITRACIN-NEOMYCIN-POLYMYXIN 400-5-5000 EX OINT
TOPICAL_OINTMENT | CUTANEOUS | Status: AC
  Filled 2016-05-10: qty 1

## 2016-05-10 MED ORDER — ASPIRIN 81 MG PO CHEW: 81.0000 mg | CHEWABLE_TABLET | ORAL | Status: DC

## 2016-05-10 MED ORDER — SODIUM CHLORIDE 0.9 % WEIGHT BASED INFUSION: 3.0000 mL/kg/h | INTRAVENOUS | Status: DC

## 2016-05-10 MED ORDER — BACITRACIN-NEOMYCIN-POLYMYXIN OINTMENT TUBE: TOPICAL_OINTMENT | Freq: Once | CUTANEOUS | Status: DC

## 2016-05-10 MED ORDER — IOPAMIDOL (ISOVUE-300) INJECTION 61%
INTRAVENOUS | Status: DC | PRN
  Administered 2016-05-10: 90 mL via INTRA_ARTERIAL

## 2016-05-10 MED ORDER — SODIUM CHLORIDE 0.9% FLUSH: 3.0000 mL | INTRAVENOUS | Status: DC | PRN

## 2016-05-10 MED ORDER — SODIUM CHLORIDE 0.9% FLUSH: 3.0000 mL | Freq: Two times a day (BID) | INTRAVENOUS | Status: DC

## 2016-05-10 MED ORDER — SODIUM CHLORIDE 0.9 % WEIGHT BASED INFUSION: 1.0000 mL/kg/h | INTRAVENOUS | Status: DC

## 2016-05-10 SURGICAL SUPPLY — 13 items
CATH INFINITI 5FR ANG PIGTAIL (CATHETERS) ×3 IMPLANT
CATH INFINITI 5FR JL4 (CATHETERS) ×3 IMPLANT
CATH INFINITI JR4 5F (CATHETERS) ×3 IMPLANT
CATH SWANZ 7F THERMO (CATHETERS) ×3 IMPLANT
DEVICE CLOSURE MYNXGRIP 5F (Vascular Products) ×3 IMPLANT
GUIDEWIRE EMER 3M J .025X150CM (WIRE) ×3 IMPLANT
KIT MANI 3VAL PERCEP (MISCELLANEOUS) ×3 IMPLANT
KIT RIGHT HEART (MISCELLANEOUS) ×6 IMPLANT
NEEDLE PERC 18GX7CM (NEEDLE) ×3 IMPLANT
PACK CARDIAC CATH (CUSTOM PROCEDURE TRAY) ×3 IMPLANT
SHEATH AVANTI 5FR X 11CM (SHEATH) ×3 IMPLANT
SHEATH PINNACLE 7F 10CM (SHEATH) ×3 IMPLANT
WIRE EMERALD 3MM-J .035X150CM (WIRE) ×3 IMPLANT

## 2016-05-10 NOTE — Discharge Instructions (Signed)

## 2016-05-10 NOTE — Progress Notes (Signed)
Patient with very reddened area along leg crease on arrival to holding from cath lab.  Dr. Juliann Paresallwood notified of this and neosporin applied.  Patient states it is VERY burning consistently

## 2016-05-12 ENCOUNTER — Encounter: Payer: Self-pay | Admitting: Emergency Medicine

## 2016-05-12 ENCOUNTER — Emergency Department
Admission: EM | Admit: 2016-05-12 | Discharge: 2016-05-12 | Disposition: A | Discharge status: Home / Self Care | Payer: BC Managed Care – PPO | Attending: Emergency Medicine | Admitting: Emergency Medicine

## 2016-05-12 DIAGNOSIS — Z87891 Personal history of nicotine dependence: Secondary | ICD-10-CM | POA: Insufficient documentation

## 2016-05-12 DIAGNOSIS — Z91040 Latex allergy status: Secondary | ICD-10-CM | POA: Insufficient documentation

## 2016-05-12 DIAGNOSIS — J45909 Unspecified asthma, uncomplicated: Secondary | ICD-10-CM | POA: Insufficient documentation

## 2016-05-12 DIAGNOSIS — I10 Essential (primary) hypertension: Secondary | ICD-10-CM | POA: Insufficient documentation

## 2016-05-12 DIAGNOSIS — R1031 Right lower quadrant pain: Secondary | ICD-10-CM | POA: Insufficient documentation

## 2016-05-12 DIAGNOSIS — Z79899 Other long term (current) drug therapy: Secondary | ICD-10-CM | POA: Insufficient documentation

## 2016-05-12 DIAGNOSIS — T148XXA Other injury of unspecified body region, initial encounter: Secondary | ICD-10-CM

## 2016-05-12 LAB — COMPREHENSIVE METABOLIC PANEL
ALBUMIN: 3.8 g/dL (ref 3.5–5.0)
ALK PHOS: 56 U/L (ref 38–126)
ALT: 18 U/L (ref 14–54)
ANION GAP: 10 (ref 5–15)
AST: 18 U/L (ref 15–41)
BILIRUBIN TOTAL: 0.5 mg/dL (ref 0.3–1.2)
BUN: 17 mg/dL (ref 6–20)
CALCIUM: 9 mg/dL (ref 8.9–10.3)
CO2: 24 mmol/L (ref 22–32)
CREATININE: 1.07 mg/dL — AB (ref 0.44–1.00)
Chloride: 104 mmol/L (ref 101–111)
GFR calc Af Amer: >60 mL/min (ref >60)
GFR calc non Af Amer: 59 mL/min — ABNORMAL LOW (ref >60)
GLUCOSE: 91 mg/dL (ref 65–99)
Potassium: 4 mmol/L (ref 3.5–5.1)
Sodium: 138 mmol/L (ref 135–145)
TOTAL PROTEIN: 7 g/dL (ref 6.5–8.1)

## 2016-05-12 LAB — CBC WITH DIFFERENTIAL/PLATELET
BASOS PCT: 1 %
Basophils Absolute: 0.1 10*3/uL (ref 0–0.1)
Eosinophils Absolute: 0.1 10*3/uL (ref 0–0.7)
Eosinophils Relative: 1 %
HEMATOCRIT: 39.5 % (ref 35.0–47.0)
HEMOGLOBIN: 12.9 g/dL (ref 12.0–16.0)
LYMPHS ABS: 4.2 10*3/uL — AB (ref 1.0–3.6)
Lymphocytes Relative: 36 %
MCH: 27.3 pg (ref 26.0–34.0)
MCHC: 32.7 g/dL (ref 32.0–36.0)
MCV: 83.4 fL (ref 80.0–100.0)
MONOS PCT: 6 %
Monocytes Absolute: 0.7 10*3/uL (ref 0.2–0.9)
NEUTROS ABS: 6.7 10*3/uL — AB (ref 1.4–6.5)
NEUTROS PCT: 56 %
Platelets: 398 10*3/uL (ref 150–440)
RBC: 4.74 MIL/uL (ref 3.80–5.20)
RDW: 15 % — ABNORMAL HIGH (ref 11.5–14.5)
WBC: 11.9 10*3/uL — ABNORMAL HIGH (ref 3.6–11.0)

## 2016-05-12 MED ORDER — HYDROCODONE-ACETAMINOPHEN 5-325 MG PO TABS
1.0000 | ORAL_TABLET | Freq: Once | ORAL | Status: AC
  Administered 2016-05-12: 1 via ORAL
  Filled 2016-05-12: qty 1

## 2016-05-12 MED ORDER — HYDROCODONE-ACETAMINOPHEN 5-325 MG PO TABS: 1.0000 | ORAL_TABLET | ORAL | Status: DC | PRN

## 2016-05-12 NOTE — Discharge Instructions (Signed)
Hematoma  A hematoma is a collection of blood under the skin, in an organ, in a body space, in a joint space, or in other tissue. The blood can clot to form a lump that you can see and feel. The lump is often firm and may sometimes become sore and tender. Most hematomas get better in a few days to weeks. However, some hematomas may be serious and require medical care. Hematomas can range in size from very small to very large.  CAUSES   A hematoma can be caused by a blunt or penetrating injury. It can also be caused by spontaneous leakage from a blood vessel under the skin. Spontaneous leakage from a blood vessel is more likely to occur in older people, especially those taking blood thinners. Sometimes, a hematoma can develop after certain medical procedures.  SIGNS AND SYMPTOMS   · A firm lump on the body.  · Possible pain and tenderness in the area.  · Bruising. Blue, dark blue, purple-red, or yellowish skin may appear at the site of the hematoma if the hematoma is close to the surface of the skin.  For hematomas in deeper tissues or body spaces, the signs and symptoms may be subtle. For example, an intra-abdominal hematoma may cause abdominal pain, weakness, fainting, and shortness of breath. An intracranial hematoma may cause a headache or symptoms such as weakness, trouble speaking, or a change in consciousness.  DIAGNOSIS   A hematoma can usually be diagnosed based on your medical history and a physical exam. Imaging tests may be needed if your health care provider suspects a hematoma in deeper tissues or body spaces, such as the abdomen, head, or chest. These tests may include ultrasonography or a CT scan.   TREATMENT   Hematomas usually go away on their own over time. Rarely does the blood need to be drained out of the body. Large hematomas or those that may affect vital organs will sometimes need surgical drainage or monitoring.  HOME CARE INSTRUCTIONS   · Apply ice to the injured area:      Put ice in a  plastic bag.      Place a towel between your skin and the bag.      Leave the ice on for 20 minutes, 2-3 times a day for the first 1 to 2 days.    · After the first 2 days, switch to using warm compresses on the hematoma.    · Elevate the injured area to help decrease pain and swelling. Wrapping the area with an elastic bandage may also be helpful. Compression helps to reduce swelling and promotes shrinking of the hematoma. Make sure the bandage is not wrapped too tight.    · If your hematoma is on a lower extremity and is painful, crutches may be helpful for a couple days.    · Only take over-the-counter or prescription medicines as directed by your health care provider.  SEEK IMMEDIATE MEDICAL CARE IF:   · You have increasing pain, or your pain is not controlled with medicine.    · You have a fever.    · You have worsening swelling or discoloration.    · Your skin over the hematoma breaks or starts bleeding.    · Your hematoma is in your chest or abdomen and you have weakness, shortness of breath, or a change in consciousness.  · Your hematoma is on your scalp (caused by a fall or injury) and you have a worsening headache or a change in alertness or consciousness.  MAKE SURE YOU:   ·   Understand these instructions.  · Will watch your condition.  · Will get help right away if you are not doing well or get worse.     This information is not intended to replace advice given to you by your health care provider. Make sure you discuss any questions you have with your health care provider.     Document Released: 06/26/2004 Document Revised: 07/15/2013 Document Reviewed: 04/22/2013  Elsevier Interactive Patient Education ©2016 [employer]

## 2016-05-12 NOTE — ED Notes (Signed)
Had heart cath 2 days ago with R groin access, increased painful and numbness and tightness since. Ecchymosis to site noted. Pulses equal.

## 2016-05-12 NOTE — ED Notes (Signed)
Pt medicated - unable to use scanner

## 2016-05-13 NOTE — ED Provider Notes (Signed)
[facility] Emergency Department Provider Note  Time seen: 11:57 PM  I have reviewed the triage vital signs and the nursing notes.   HISTORY  Chief Complaint Groin Pain    HPI Kaitlin Garner is a 50 y.o. female with a past medical history of heartburn, asthma, hypertension, dyspnea who presents the emergency department with right groin discomfort and ecchymosis. According to the patient she has been being worked up by her primary care doctor as well as cardiology for her intermittent shortness of breath and chest pain she has been experiencing. Patient states she had a cardiac catheterization which was normal performed 2 days ago. It was complicated by bleeding requiring direct pressure for a prolonged period day nurse for the patient. Patient states she has had growing pain in that area ever since, today she has noted bruising of the right thigh which appears to be enlarging so they came to the emergency department for evaluation. Denies any numbness or weakness of the leg.     Past Medical History  Diagnosis Date  . Heartburn   . Asthma   . Hypertension   . Shortness of breath dyspnea     There are no active problems to display for this patient.   Past Surgical History  Procedure Laterality Date  . Cesarean section    . Tonsillectomy    . Back surgery    . Right arthroscopy     . Ablation      Current Outpatient Rx  Name  Route  Sig  Dispense  Refill  . albuterol (PROVENTIL HFA;VENTOLIN HFA) 108 (90 Base) MCG/ACT inhaler   Inhalation   Inhale 2 puffs into the lungs every 4 (four) hours as needed for wheezing or shortness of breath.   1 Inhaler   1   . ALPRAZolam (XANAX) 0.5 MG tablet   Oral   Take 0.5 mg by mouth 2 (two) times daily as needed for anxiety.         . cetirizine (ZYRTEC) 10 MG tablet   Oral   Take 10 mg by mouth daily.         . chlorpheniramine-HYDROcodone (TUSSIONEX PENNKINETIC ER) 10-8 MG/5ML SUER   Oral   Take 5  mLs by mouth every 12 (twelve) hours as needed for cough. Patient not taking: Reported on 05/10/2016   115 mL   0   . Cholecalciferol (VITAMIN D PO)   Oral   Take by mouth.         . escitalopram (LEXAPRO) 10 MG tablet   Oral   Take 10 mg by mouth daily.         Marland Kitchen esomeprazole (NEXIUM) 40 MG capsule   Oral   Take 40 mg by mouth daily at 12 noon.         . fexofenadine-pseudoephedrine (ALLEGRA-D ALLERGY & CONGESTION) 180-240 MG 24 hr tablet   Oral   Take 1 tablet by mouth daily. Patient not taking: Reported on 05/10/2016   30 tablet   0   . HYDROcodone-acetaminophen (NORCO/VICODIN) 5-325 MG tablet   Oral   Take 1 tablet by mouth every 4 (four) hours as needed.   15 tablet   0   . ipratropium (ATROVENT) 0.02 % nebulizer solution   Nebulization   Take 0.5 mg by nebulization every 4 (four) hours as needed for wheezing or shortness of breath.         . levofloxacin (LEVAQUIN) 500 MG tablet   Oral  Take 1 tablet (500 mg total) by mouth daily. Patient not taking: Reported on 05/10/2016   10 tablet   0   . levothyroxine (SYNTHROID, LEVOTHROID) 25 MCG tablet   Oral   Take 25 mcg by mouth daily before breakfast.         . mometasone-formoterol (DULERA) 100-5 MCG/ACT AERO   Inhalation   Inhale 2 puffs into the lungs 2 (two) times daily.         Marland Kitchen oseltamivir (TAMIFLU) 75 MG capsule   Oral   Take 1 capsule (75 mg total) by mouth 2 (two) times daily. Patient not taking: Reported on 05/10/2016   10 capsule   0   . pantoprazole (PROTONIX) 40 MG tablet   Oral   Take 40 mg by mouth daily.           Allergies Fish allergy; Latex; and Sulfa antibiotics  No family history on file.  Social History Social History  Substance Use Topics  . Smoking status: Former Smoker    Quit date: 05/10/2004  . Smokeless tobacco: None  . Alcohol Use: No    Review of Systems Constitutional: Negative for fever. Cardiovascular: Intermittent chest pain,  long-standing. Respiratory: Intermittent shortness of breath, long-standing. Gastrointestinal: Negative for abdominal pain Musculoskeletal: Right groin pain/medial thigh pain Skin: Bruising of the right medial thigh 10-point ROS otherwise negative.  ____________________________________________   PHYSICAL EXAM:  VITAL SIGNS: ED Triage Vitals  Enc Vitals Group     BP 05/12/16 1653 111/69 mmHg     Pulse Rate 05/12/16 1653 79     Resp 05/12/16 1653 18     Temp 05/12/16 1653 97.6 F (36.4 C)     Temp Source 05/12/16 1653 Oral     SpO2 05/12/16 1653 96 %     Weight 05/12/16 1653 300 lb (136.079 kg)     Height 05/12/16 1653 5\' 6"  (1.676 m)     Head Cir --      Peak Flow --      Pain Score 05/12/16 1654 8     Pain Loc --      Pain Edu? --      Excl. in GC? --     Constitutional: Alert and oriented. Well appearing and in no distress. Eyes: Normal exam ENT   Head: Normocephalic and atraumatic.   Mouth/Throat: Mucous membranes are moist. Cardiovascular: Normal rate, regular rhythm. No murmur Respiratory: Normal respiratory effort without tachypnea nor retractions. Breath sounds are clear  Gastrointestinal: Soft and nontender. No distention.   Musculoskeletal: Puncture site appears well in the right groin, no hematoma palpated. Patient doesn't mild tenderness palpation of this area, ecchymosis extending to the medial thigh purple in color, non-firm, soft. 2+ DP pulse. Sensation intact. Neurologic:  Normal speech and language. No gross focal neurologic deficits Psychiatric: Mood and affect are normal.  ____________________________________________   INITIAL IMPRESSION / ASSESSMENT AND PLAN / ED COURSE  Pertinent labs & imaging results that were available during my care of the patient were reviewed by me and considered in my medical decision making (see chart for details).  The patient presents the emergency department for ecchymosis on the right medial thigh. Patient  recently had a cardiac catheterization, states they had applied direct pressure for a prolonged time after the procedure due to bleeding. Patient likely had a small hematoma form which is now resolving with gravity dependent ecchymosis. Leg is neurovascularly intact, 2+ DP pulse. We will discharge with a short course of pain medication.  Overall the patient appears very well.  ____________________________________________   FINAL CLINICAL IMPRESSION(S) / ED DIAGNOSES  Hematoma   Minna AntisKevin Bricia Taher, MD 05/14/16 0000

## 2016-05-14 ENCOUNTER — Encounter: Payer: Self-pay | Admitting: Internal Medicine

## 2016-06-08 ENCOUNTER — Other Ambulatory Visit: Payer: Self-pay | Admitting: Family Medicine

## 2016-06-08 DIAGNOSIS — J45909 Unspecified asthma, uncomplicated: Secondary | ICD-10-CM

## 2016-06-08 DIAGNOSIS — T17308A Unspecified foreign body in larynx causing other injury, initial encounter: Secondary | ICD-10-CM

## 2016-06-12 ENCOUNTER — Ambulatory Visit
Admission: RE | Admit: 2016-06-12 | Discharge: 2016-06-12 | Disposition: A | Discharge status: Home / Self Care | Payer: BC Managed Care – PPO | Source: Ambulatory Visit | Attending: Family Medicine | Admitting: Family Medicine

## 2016-06-12 ENCOUNTER — Other Ambulatory Visit: Payer: Self-pay

## 2016-06-12 DIAGNOSIS — T17308A Unspecified foreign body in larynx causing other injury, initial encounter: Secondary | ICD-10-CM

## 2016-06-12 DIAGNOSIS — K219 Gastro-esophageal reflux disease without esophagitis: Secondary | ICD-10-CM | POA: Diagnosis not present

## 2016-06-12 DIAGNOSIS — J45909 Unspecified asthma, uncomplicated: Secondary | ICD-10-CM

## 2016-07-23 ENCOUNTER — Ambulatory Visit: Payer: BC Managed Care – PPO | Admitting: Anesthesiology

## 2016-07-23 ENCOUNTER — Encounter
Admission: RE | Disposition: A | Discharge status: Home / Self Care | Payer: Self-pay | Source: Ambulatory Visit | Attending: Gastroenterology

## 2016-07-23 ENCOUNTER — Ambulatory Visit
Admission: RE | Admit: 2016-07-23 | Discharge: 2016-07-23 | Disposition: A | Discharge status: Home / Self Care | Payer: BC Managed Care – PPO | Source: Ambulatory Visit | Attending: Gastroenterology | Admitting: Gastroenterology

## 2016-07-23 ENCOUNTER — Encounter: Payer: Self-pay | Admitting: *Deleted

## 2016-07-23 DIAGNOSIS — Z91040 Latex allergy status: Secondary | ICD-10-CM | POA: Insufficient documentation

## 2016-07-23 DIAGNOSIS — R1310 Dysphagia, unspecified: Secondary | ICD-10-CM | POA: Insufficient documentation

## 2016-07-23 DIAGNOSIS — J45909 Unspecified asthma, uncomplicated: Secondary | ICD-10-CM | POA: Insufficient documentation

## 2016-07-23 DIAGNOSIS — Z87891 Personal history of nicotine dependence: Secondary | ICD-10-CM | POA: Diagnosis not present

## 2016-07-23 DIAGNOSIS — K449 Diaphragmatic hernia without obstruction or gangrene: Secondary | ICD-10-CM | POA: Insufficient documentation

## 2016-07-23 DIAGNOSIS — Z888 Allergy status to other drugs, medicaments and biological substances status: Secondary | ICD-10-CM | POA: Insufficient documentation

## 2016-07-23 DIAGNOSIS — K210 Gastro-esophageal reflux disease with esophagitis: Secondary | ICD-10-CM | POA: Insufficient documentation

## 2016-07-23 DIAGNOSIS — E039 Hypothyroidism, unspecified: Secondary | ICD-10-CM | POA: Diagnosis not present

## 2016-07-23 DIAGNOSIS — Z79899 Other long term (current) drug therapy: Secondary | ICD-10-CM | POA: Insufficient documentation

## 2016-07-23 DIAGNOSIS — Z9889 Other specified postprocedural states: Secondary | ICD-10-CM | POA: Diagnosis not present

## 2016-07-23 DIAGNOSIS — F419 Anxiety disorder, unspecified: Secondary | ICD-10-CM | POA: Insufficient documentation

## 2016-07-23 DIAGNOSIS — Z6841 Body Mass Index (BMI) 40.0 and over, adult: Secondary | ICD-10-CM | POA: Diagnosis not present

## 2016-07-23 DIAGNOSIS — K2950 Unspecified chronic gastritis without bleeding: Secondary | ICD-10-CM | POA: Insufficient documentation

## 2016-07-23 DIAGNOSIS — I10 Essential (primary) hypertension: Secondary | ICD-10-CM | POA: Insufficient documentation

## 2016-07-23 DIAGNOSIS — Z91018 Allergy to other foods: Secondary | ICD-10-CM | POA: Insufficient documentation

## 2016-07-23 DIAGNOSIS — K228 Other specified diseases of esophagus: Secondary | ICD-10-CM | POA: Insufficient documentation

## 2016-07-23 HISTORY — PX: ESOPHAGOGASTRODUODENOSCOPY (EGD) WITH PROPOFOL: SHX5813

## 2016-07-23 HISTORY — DX: Hypothyroidism, unspecified: E03.9

## 2016-07-23 HISTORY — DX: Anxiety disorder, unspecified: F41.9

## 2016-07-23 SURGERY — ESOPHAGOGASTRODUODENOSCOPY (EGD) WITH PROPOFOL
Anesthesia: General

## 2016-07-23 MED ORDER — PROPOFOL 500 MG/50ML IV EMUL
INTRAVENOUS | Status: DC | PRN
  Administered 2016-07-23: 160 ug/kg/min via INTRAVENOUS

## 2016-07-23 MED ORDER — SODIUM CHLORIDE 0.9 % IV SOLN: INTRAVENOUS | Status: DC

## 2016-07-23 MED ORDER — MIDAZOLAM HCL 5 MG/5ML IJ SOLN
INTRAMUSCULAR | Status: DC | PRN
  Administered 2016-07-23: 1 mg via INTRAVENOUS

## 2016-07-23 MED ORDER — FENTANYL CITRATE (PF) 100 MCG/2ML IJ SOLN
INTRAMUSCULAR | Status: DC | PRN
  Administered 2016-07-23: 50 ug via INTRAVENOUS

## 2016-07-23 MED ORDER — SODIUM CHLORIDE 0.9 % IV SOLN
INTRAVENOUS | Status: DC
Start: 2016-07-23 — End: 2016-07-23
  Administered 2016-07-23: 14:00:00 via INTRAVENOUS

## 2016-07-23 MED ORDER — LIDOCAINE 2% (20 MG/ML) 5 ML SYRINGE
INTRAMUSCULAR | Status: DC | PRN
Start: 2016-07-23 — End: 2016-07-23
  Administered 2016-07-23: 40 mg via INTRAVENOUS

## 2016-07-23 MED ORDER — PROPOFOL 10 MG/ML IV BOLUS
INTRAVENOUS | Status: DC | PRN
  Administered 2016-07-23: 100 mg via INTRAVENOUS

## 2016-07-23 NOTE — Op Note (Signed)
[facility] Gastroenterology Patient Name: Kaitlin Garner Procedure Date: 07/23/2016 1:54 PM MRN: [id number] Account #: [id number] Date of Birth: 09/04/1966 Admit Type: Outpatient Age: [id number] Room: [facility] ENDO ROOM 4 Gender: Female Note Status: Finalized Procedure:            Upper GI endoscopy Indications:          Dysphagia, Gastro-esophageal reflux disease Providers:            Christena DeemMartin U. Skulskie, MD Referring MD:         Melanee SpryVicki S. Ether GriffinsFowler (Referring MD) Medicines:            Monitored Anesthesia Care Complications:        No immediate complications. Procedure:            Pre-Anesthesia Assessment:                       - ASA Grade Assessment: III - A patient with severe                        systemic disease.                       After obtaining informed consent, the endoscope was                        passed under direct vision. Throughout the procedure,                        the patient's blood pressure, pulse, and oxygen                        saturations were monitored continuously. The Endoscope                        was introduced through the mouth, and advanced to the                        second to third part of duodenum. The upper GI                        endoscopy was accomplished without difficulty. The                        patient tolerated the procedure well. Findings:      The Z-line was variable. Biopsies were taken with a cold forceps for       histology.      The exam of the esophagus was otherwise normal.      Diffuse and patchy minimal inflammation characterized by erythema was       found in the gastric body.      A small hiatal hernia was found. The Z-line was a variable distance from       incisors; the hiatal hernia was sliding.      The examined duodenum was normal, note prominant intraduodenal portion       of the CBD. Impression:           - Z-line variable. Biopsied.                       - Bile gastritis.                       -  Small hiatal hernia. Recommendation:       - Use Protonix (pantoprazole) 40 mg PO daily daily.                       - Use sucralfate tablets 1 gram PO QID.                       - Return to GI clinic in 3 weeks. Procedure Code(s):    --- Professional ---                       [id number], Esophagogastroduodenoscopy, flexible, transoral;                        with biopsy, single or multiple Diagnosis Code(s):    --- Professional ---                       K22.8, Other specified diseases of esophagus                       K29.60, Other gastritis without bleeding                       K44.9, Diaphragmatic hernia without obstruction or                        gangrene                       R13.10, Dysphagia, unspecified                       K21.9, Gastro-esophageal reflux disease without                        esophagitis CPT copyright 2016 [employer] Association. All rights reserved. The codes documented in this report are preliminary and upon coder review may  be revised to meet current compliance requirements. Christena Deem, MD 07/23/2016 2:40:54 PM This report has been signed electronically. Number of Addenda: 0 Note Initiated On: 07/23/2016 1:54 PM      [facility]

## 2016-07-23 NOTE — Transfer of Care (Signed)
Immediate Anesthesia Transfer of Care Note  Patient: Kaitlin Garner  Procedure(s) Performed: Procedure(s): ESOPHAGOGASTRODUODENOSCOPY (EGD) WITH PROPOFOL (N/A)  Patient Location: PACU and Endoscopy Unit  Anesthesia Type:General  Level of Consciousness: awake, oriented and patient cooperative  Airway & Oxygen Therapy: Patient Spontanous Breathing and Patient connected to nasal cannula oxygen  Post-op Assessment: Report given to RN and Post -op Vital signs reviewed and stable  Post vital signs: Reviewed and stable  Last Vitals:  Vitals:   07/23/16 1245  BP: 118/68  Resp: 20  Temp: 37.4 C    Last Pain: There were no vitals filed for this visit.       Complications: No apparent anesthesia complications

## 2016-07-23 NOTE — Anesthesia Preprocedure Evaluation (Signed)
Anesthesia Evaluation  Patient identified by MRN, date of birth, ID band Patient awake    Reviewed: Allergy & Precautions, H&P , NPO status , Patient's Chart, lab work & pertinent test results, reviewed documented beta blocker date and time   Airway Mallampati: II   Neck ROM: full    Dental  (+) Teeth Intact   Pulmonary neg pulmonary ROS, shortness of breath and with exertion, asthma , former smoker,    Pulmonary exam normal        Cardiovascular hypertension, negative cardio ROS Normal cardiovascular exam Rhythm:regular Rate:Normal     Neuro/Psych negative neurological ROS  negative psych ROS   GI/Hepatic negative GI ROS, Neg liver ROS,   Endo/Other  negative endocrine ROSHypothyroidism Morbid obesity  Renal/GU negative Renal ROS  negative genitourinary   Musculoskeletal   Abdominal   Peds  Hematology negative hematology ROS (+)   Anesthesia Other Findings Past Medical History: No date: Anxiety No date: Asthma No date: Heartburn No date: Hypertension No date: Hypothyroidism No date: Shortness of breath dyspnea Past Surgical History: No date: ABLATION No date: BACK SURGERY 05/10/2016: CARDIAC CATHETERIZATION Bilateral     Comment: Procedure: Right/Left Heart Cath and Coronary               Angiography;  Surgeon: Alwyn Peawayne D Callwood, MD;                Location: [facility] INVASIVE CV LAB;  Service:               Cardiovascular;  Laterality: Bilateral; No date: CESAREAN SECTION No date: Right Arthroscopy  No date: TONSILLECTOMY BMI    Body Mass Index:  50.84 kg/m     Reproductive/Obstetrics                             Anesthesia Physical Anesthesia Plan  ASA: III  Anesthesia Plan: General   Post-op Pain Management:    Induction:   Airway Management Planned:   Additional Equipment:   Intra-op Plan:   Post-operative Plan:   Informed Consent: I have reviewed the patients  History and Physical, chart, labs and discussed the procedure including the risks, benefits and alternatives for the proposed anesthesia with the patient or authorized representative who has indicated his/her understanding and acceptance.   Dental Advisory Given  Plan Discussed with: CRNA  Anesthesia Plan Comments:         Anesthesia Quick Evaluation

## 2016-07-23 NOTE — H&P (Addendum)
Outpatient short stay form Pre-procedure 07/23/2016 2:11 PM Kaitlin Garner Kaitlin Ngu MD  Primary Physician: Dr. Wendie SimmerVickie Fowler  Reason for visit:  EGD  History of present illness:  Patient is a 50 year old female presenting today as above. She has a history of an episode of bronchitis and pneumonia that started in March 2017. Since that time she has had shortness of breath and cough. She has had multiple evaluations by other subspecialties including pulmonology and cardiology without resolution of her symptoms. She does have a long history of gas after reflux. She states that sometimes rice cancer issues when she swallows ever she does not regurgitate foods. He did have a upper GI series done that was limited due to body habitus however the thoracic esophagus was widely patent there was a sliding hiatal hernia noted stomach and duodenum noted no significant reflux and a standardized barium tablet passed normally. Patient denies use of blood thinners or aspirin products.    Current Facility-Administered Medications:  .  0.9 %  sodium chloride infusion, , Intravenous, Continuous, Kaitlin Garner Kaitlin Common, MD .  0.9 %  sodium chloride infusion, , Intravenous, Continuous, Kaitlin Garner Kaitlin Ferrentino, MD  Prescriptions Prior to Admission  Medication Sig Dispense Refill Last Dose  . albuterol (PROVENTIL HFA;VENTOLIN HFA) 108 (90 Base) MCG/ACT inhaler Inhale 2 puffs into the lungs every 4 (four) hours as needed for wheezing or shortness of breath. 1 Inhaler 1 07/23/2016 at Unknown time  . cetirizine (ZYRTEC) 10 MG tablet Take 10 mg by mouth daily.   05/09/2016 at Unknown time  . Cholecalciferol (VITAMIN D PO) Take by mouth.   Past Week at Unknown time  . escitalopram (LEXAPRO) 10 MG tablet Take 10 mg by mouth daily.   07/22/2016 at Unknown time  . fexofenadine-pseudoephedrine (ALLEGRA-D ALLERGY & CONGESTION) 180-240 MG 24 hr tablet Take 1 tablet by mouth daily. 30 tablet 0 Past Week at Unknown time  . HYDROcodone-acetaminophen  (NORCO/VICODIN) 5-325 MG tablet Take 1 tablet by mouth every 4 (four) hours as needed. 15 tablet 0 Past Week at Unknown time  . ipratropium (ATROVENT) 0.02 % nebulizer solution Take 0.5 mg by nebulization every 4 (four) hours as needed for wheezing or shortness of breath.   07/22/2016 at Unknown time  . levothyroxine (SYNTHROID, LEVOTHROID) 25 MCG tablet Take 25 mcg by mouth daily before breakfast.   07/23/2016 at Unknown time  . mometasone-formoterol (DULERA) 100-5 MCG/ACT AERO Inhale 2 puffs into the lungs 2 (two) times daily.   07/23/2016 at Unknown time  . pantoprazole (PROTONIX) 40 MG tablet Take 40 mg by mouth daily.   07/22/2016 at Unknown time  . ALPRAZolam (XANAX) 0.5 MG tablet Take 0.5 mg by mouth 2 (two) times daily as needed for anxiety.   05/09/2016 at Unknown time  . chlorpheniramine-HYDROcodone (TUSSIONEX PENNKINETIC ER) 10-8 MG/5ML SUER Take 5 mLs by mouth every 12 (twelve) hours as needed for cough. (Patient not taking: Reported on 05/10/2016) 115 mL 0 Completed Course at Unknown time  . esomeprazole (NEXIUM) 40 MG capsule Take 40 mg by mouth daily at 12 noon.   Not Taking at Unknown time  . levofloxacin (LEVAQUIN) 500 MG tablet Take 1 tablet (500 mg total) by mouth daily. (Patient not taking: Reported on 05/10/2016) 10 tablet 0 Completed Course at Unknown time  . oseltamivir (TAMIFLU) 75 MG capsule Take 1 capsule (75 mg total) by mouth 2 (two) times daily. (Patient not taking: Reported on 05/10/2016) 10 capsule 0 Not Taking at Unknown time  Allergies  Allergen Reactions  . Fish Allergy Anaphylaxis and Swelling  . Latex Swelling and Other (See Comments)    Blisters  . Sulfa Antibiotics Swelling  . Nystatin Rash     Past Medical History:  Diagnosis Date  . Anxiety   . Asthma   . Heartburn   . Hypertension   . Hypothyroidism   . Shortness of breath dyspnea     Review of systems:      Physical Exam    Heart and lungs: Regular rate and rhythm without rub or gallop, lungs  are bilaterally clear.    HEENT: Normocephalic atraumatic eyes are anicteric    Other:     Pertinant exam for procedure: Soft nontender nondistended bowel sounds positive normoactive, obese.    Planned proceedures: EGD and indicated procedures. I have discussed the risks benefits and complications of procedures to include not limited to bleeding, infection, perforation and the risk of sedation and the patient wishes to proceed.    Kaitlin Deem, MD Gastroenterology 07/23/2016  2:11 PM    I am documenting a possible latex exposure.  I am  Unsure as to whether I had used a latex glove  For the procedure.  I was handed a non-latex glove for use. Patient was observed for an extended time in the endo recovery area, showed no ill effects, and no evidence as she had described having occurred in the past with swelling of the lips and mouth. Recovery was routine and uneventful.

## 2016-07-24 NOTE — Anesthesia Postprocedure Evaluation (Signed)
Anesthesia Post Note  Patient: Kaitlin Garner  Procedure(s) Performed: Procedure(s) (LRB): ESOPHAGOGASTRODUODENOSCOPY (EGD) WITH PROPOFOL (N/A)  Patient location during evaluation: PACU Anesthesia Type: General Level of consciousness: awake and alert Pain management: pain level controlled Vital Signs Assessment: post-procedure vital signs reviewed and stable Respiratory status: spontaneous breathing, nonlabored ventilation, respiratory function stable and patient connected to nasal cannula oxygen Cardiovascular status: blood pressure returned to baseline and stable Postop Assessment: no signs of nausea or vomiting Anesthetic complications: no    Last Vitals:  Vitals:   07/23/16 1510 07/23/16 1520  BP: 124/82 123/84  Pulse: 70 72  Resp: (!) 21 18  Temp:      Last Pain:  Vitals:   07/24/16 0742  TempSrc:   PainSc: 0-No pain                 Yevette EdwardsJames G Ivori Storr

## 2016-07-25 LAB — SURGICAL PATHOLOGY

## 2016-09-06 ENCOUNTER — Encounter: Payer: Self-pay | Admitting: Gastroenterology

## 2016-12-21 ENCOUNTER — Other Ambulatory Visit: Payer: Self-pay | Admitting: Family Medicine

## 2016-12-21 ENCOUNTER — Ambulatory Visit
Admission: RE | Admit: 2016-12-21 | Discharge: 2016-12-21 | Disposition: A | Discharge status: Home / Self Care | Payer: BC Managed Care – PPO | Source: Ambulatory Visit | Attending: Family Medicine | Admitting: Family Medicine

## 2016-12-21 DIAGNOSIS — R05 Cough: Secondary | ICD-10-CM

## 2016-12-21 DIAGNOSIS — R059 Cough, unspecified: Secondary | ICD-10-CM

## 2016-12-21 DIAGNOSIS — M47816 Spondylosis without myelopathy or radiculopathy, lumbar region: Secondary | ICD-10-CM | POA: Diagnosis not present

## 2016-12-21 DIAGNOSIS — M47814 Spondylosis without myelopathy or radiculopathy, thoracic region: Secondary | ICD-10-CM | POA: Diagnosis not present

## 2017-06-19 ENCOUNTER — Other Ambulatory Visit: Payer: Self-pay | Admitting: Family Medicine

## 2017-06-19 DIAGNOSIS — Z1239 Encounter for other screening for malignant neoplasm of breast: Secondary | ICD-10-CM

## 2017-08-02 ENCOUNTER — Ambulatory Visit
Admission: RE | Admit: 2017-08-02 | Discharge: 2017-08-02 | Disposition: A | Discharge status: Home / Self Care | Payer: BC Managed Care – PPO | Source: Ambulatory Visit | Attending: Family Medicine | Admitting: Family Medicine

## 2017-08-02 DIAGNOSIS — Z1231 Encounter for screening mammogram for malignant neoplasm of breast: Secondary | ICD-10-CM | POA: Diagnosis present

## 2017-08-02 DIAGNOSIS — Z1239 Encounter for other screening for malignant neoplasm of breast: Secondary | ICD-10-CM

## 2018-02-10 ENCOUNTER — Other Ambulatory Visit: Payer: Self-pay | Admitting: Gastroenterology

## 2018-02-10 DIAGNOSIS — R1011 Right upper quadrant pain: Secondary | ICD-10-CM

## 2018-02-11 ENCOUNTER — Encounter (INDEPENDENT_AMBULATORY_CARE_PROVIDER_SITE_OTHER): Payer: Self-pay

## 2018-02-11 ENCOUNTER — Ambulatory Visit
Admission: RE | Admit: 2018-02-11 | Discharge: 2018-02-11 | Disposition: A | Discharge status: Home / Self Care | Payer: BC Managed Care – PPO | Source: Ambulatory Visit | Attending: Gastroenterology | Admitting: Gastroenterology

## 2018-02-11 DIAGNOSIS — R1011 Right upper quadrant pain: Secondary | ICD-10-CM | POA: Insufficient documentation

## 2018-02-11 DIAGNOSIS — K8020 Calculus of gallbladder without cholecystitis without obstruction: Secondary | ICD-10-CM | POA: Insufficient documentation

## 2018-02-11 DIAGNOSIS — R932 Abnormal findings on diagnostic imaging of liver and biliary tract: Secondary | ICD-10-CM | POA: Diagnosis not present

## 2018-02-14 ENCOUNTER — Ambulatory Visit: Payer: Self-pay | Admitting: General Surgery

## 2018-02-14 NOTE — H&P (Signed)
PATIENT PROFILE: Kaitlin Garner is a 52 y.o. female who presents to the Clinic for consultation at the request of Kaitlin Garner for evaluation of cholelithiasis.  PCP:  Kaitlin Gentile, MD  HISTORY OF PRESENT ILLNESS: Kaitlin Garner reports starting to feel pain on the right upper quadrant pain around 3 weeks ago. The patient refers started during the an admission for acute diverticulitis but it was mild and thought that was due to the same process of the diverticulitis. Pain has been increasing with time. Pain is localized to the right upper quadrant and does not radiates to other part of the body. Pain is aggravated with food, patient refers that is with any type of food. Has developed nausea but not all the time. Pain improve a little bit by itself with time but has not resolved completely. Denies episodes of fever or chills. Denies seeing her eyes yellow.    PROBLEM LIST:         Problem List  Date Reviewed: 02/08/2018         Noted   Renal mass 12/24/2017   Acquired hypothyroidism, unspecified 11/16/2015   Vitamin D deficiency, unspecified 11/16/2015   GERD (gastroesophageal reflux disease) 12/11/2013   Overview    Since 1985. EGD completed          GENERAL REVIEW OF SYSTEMS:   General ROS: negative for - chills, fatigue, fever, weight gain or weight loss Allergy and Immunology ROS: negative for - hives  Hematological and Lymphatic ROS: negative for - bleeding problems or bruising, negative for palpable nodes Endocrine ROS: negative for - heat or cold intolerance, hair changes Respiratory ROS: negative for - cough, shortness of breath or wheezing Cardiovascular ROS: no chest pain or palpitations GI ROS: negative for vomiting, abdominal pain, Positive for nausea, diarrhea and constipation Musculoskeletal ROS: negative for - joint swelling or muscle pain Neurological ROS: negative for - confusion, syncope Dermatological ROS: negative for pruritus Positive for  rash Psychiatric: negative for anxiety, depression, difficulty sleeping and memory loss  MEDICATIONS: CurrentMedications        Current Outpatient Medications  Medication Sig Dispense Refill  . ALPRAZolam (XANAX) 0.5 MG tablet Take by mouth    . EPINEPHrine (EPIPEN) 0.3 mg/0.3 mL pen injector Inject subcutaneously    . ergocalciferol, vitamin D2, 50,000 unit capsule Take 1 capsule (50,000 Units total) by mouth once a week 4 capsule 3  . levocetirizine (XYZAL) 5 MG tablet Take 1 tablet (5 mg total) by mouth every evening. For allergies 30 tablet 11  . levothyroxine (SYNTHROID, LEVOTHROID) 125 MCG tablet Take 1 tablet (125 mcg total) by mouth once daily 30 tablet 1  . pantoprazole (PROTONIX) 40 MG DR tablet Take 1 tablet (40 mg total) by mouth 2 (two) times daily 60 tablet 5  . PARoxetine (PAXIL-CR) 12.5 MG CR tablet Take 1 tablet (12.5 mg total) by mouth once daily For anxiety 30 tablet 1  . valACYclovir (VALTREX) 1000 MG tablet Take 2 tablets po 12 hours apart for cold sores 28 tablet 3  . diphenhydrAMINE (BENADRYL) 50 MG capsule Take one tablet one hour prior to your procedure (Patient not taking: Reported on 02/14/2018 ) 1 capsule 0  . predniSONE (DELTASONE) 50 MG tablet Take 50 mg by mouth 13 hours before your procedure, then take 50 mg 7 hours and then 50 mg  1 hour before procedure. (Patient not taking: Reported on 02/14/2018 ) 3 tablet 0   No current facility-administered medications for this visit.  ALLERGIES: Fish containing products; Lentils; Iodinated contrast- oral and iv dye; Latex; Nystatin; Pollen extracts; and Sulfur  PAST MEDICAL HISTORY:     Past Medical History:  Diagnosis Date  . Acquired hypothyroidism 11/16/2015  . Acquired hypothyroidism 11/16/2015  . Allergic state   . Arthritis   . Asthma without status asthmaticus   . Cough 03/09/2016  . Diverticulitis   . Dyspnea and respiratory abnormality 04/26/2016  . Eczema   . GERD (gastroesophageal  reflux disease)   . Hypothyroidism (acquired), unspecified   . Obesity, morbid, BMI 50 or higher (CMS-HCC)   . Poor intravenous access    "rolling veins"  . Reflux esophagitis 07/23/2016  . Vitamin D deficiency 11/16/2015    PAST SURGICAL HISTORY:      Past Surgical History:  Procedure Laterality Date  . BACK SURGERY  1995   Disc removal  . CESAREAN SECTION  11/19/1985  . CESAREAN SECTION  05/18/1992  . disc removed N/A   . EGD  07/23/2016   Reflux esophagitis/Gastritis/No Repeat/MUS  . KNEE ARTHROSCOPY  7/282016  . TONSILLECTOMY    . TUBAL LIGATION    . uterine ablation       FAMILY HISTORY:      Family History  Problem Relation Age of Onset  . Thyroid disease Mother   . Thyroid disease Sister   . Depression Sister   . No Known Problems Father   . Stroke Maternal Grandmother   . Thyroid disease Sister   . Depression Sister   . Thyroid disease Sister   . Thyroid disease Sister      SOCIAL HISTORY: Social History          Socioeconomic History  . Marital status: Single    Spouse name: Not on file  . Number of children: Not on file  . Years of education: Not on file  . Highest education level: Not on file  Occupational History  . Not on file  Social Needs  . Financial resource strain: Not on file  . Food insecurity:    Worry: Not on file    Inability: Not on file  . Transportation needs:    Medical: Not on file    Non-medical: Not on file  Tobacco Use  . Smoking status: Former Smoker    Types: Cigarettes    Last attempt to quit: 11/26/2006    Years since quitting: 11.2  . Smokeless tobacco: Never Used  Substance and Sexual Activity  . Alcohol use: Yes    Comment: occ  . Drug use: No  . Sexual activity: Yes    Partners: Male    Birth control/protection: Other-see comments    Comment: ablation and tubes tied  Other Topics Concern  . Would you please tell us about the people who live in your  home, your pets, or anything else important to your social life? No  Social History Narrative   Divorced   Daughter nearby and has a young granddaughter   Works at [employer] here from [place] in 2011      PHYSICAL EXAM:    Vitals:   02/14/18 1022  BP: (!) 138/99  Pulse: 79  Temp: 36.1 C (96.9 F)   Body mass index is 43.93 kg/m. Weight: (!) 123.4 kg (272 lb)   GENERAL: Alert, active, oriented x3  HEENT: Pupils equal reactive to light. Extraocular movements are intact. Sclera clear. Palpebral conjunctiva normal red color.Pharynx clear.  NECK: Supple with no palpable mass and  no adenopathy.  LUNGS: Sound clear with no rales rhonchi or wheezes.  HEART: Regular rhythm S1 and S2 without murmur.  ABDOMEN: Soft and depressible, nontender with no palpable mass, no hepatomegaly.   EXTREMITIES: Well-developed well-nourished symmetrical with no dependent edema.  NEUROLOGICAL: Awake alert oriented, facial expression symmetrical, moving all extremities.  REVIEW OF DATA: I have reviewed the following data today:      Office Visit on 01/13/2018  Component Date Value  . Color 01/13/2018 Yellow   . Clarity 01/13/2018 Clear   . Specific Gravity 01/13/2018 1.010   . pH, Urine 01/13/2018 5.5   . Protein, Urinalysis 01/13/2018 Negative   . Glucose, Urinalysis 01/13/2018 Negative   . Ketones, Urinalysis 01/13/2018 Negative   . Blood, Urinalysis 01/13/2018 Trace*  . Nitrite, Urinalysis 01/13/2018 Negative   . Leukocytes, Urinalysis 01/13/2018 Negative   . Bilirubin, Urinalysis 01/13/2018 Negative   . Urobilinogen, Urinalysis 01/13/2018 0.2   . WBC (White Blood Cell Co* 01/13/2018 9.2   . Hemoglobin 01/13/2018 13.0   . Hematocrit 01/13/2018 39.2   . Platelets 01/13/2018 382   . MCV (Mean Corpuscular Vo* 01/13/2018 85   . MCH (Mean Corpuscular He* 01/13/2018 28.3   . MCHC (Mean Corpuscular H* 01/13/2018 33.2   . RBC (Red Blood Cell Coun* 01/13/2018 4.60    . RDW-CV (Red Cell Distrib* 01/13/2018 14.1   . MPV (Mean Platelet Volum* 01/13/2018 9.8   . Neutrophil Count 01/13/2018 5.0   . Neutrophil % 01/13/2018 54.8   . Lymphocyte Count 01/13/2018 3.0   . Lymphocyte % 01/13/2018 32.9   . Monocyte Count 01/13/2018 0.7   . Monocyte % 01/13/2018 8.0   . Eosinophil Count 01/13/2018 0.38   . Eosinophil % 01/13/2018 4.1   . Basophil Count 01/13/2018 0.02   . Basophil % 01/13/2018 0.2   . Sodium 01/13/2018 137   . Potassium 01/13/2018 4.5   . Chloride 01/13/2018 101   . Carbon Dioxide (CO2) 01/13/2018 23   . Urea Nitrogen (BUN) 01/13/2018 11   . Creatinine 01/13/2018 0.9   . Glucose 01/13/2018 78   . Calcium 01/13/2018 9.6   . AST (Aspartate Aminotran* 01/13/2018 23   . ALT (Alanine Aminotransf* 01/13/2018 15   . Bilirubin, Total 01/13/2018 1.3   . Alk Phos (Alkaline Phosp* 01/13/2018 67   . Albumin 01/13/2018 3.8   . Protein, Total 01/13/2018 6.7   . Anion Gap 01/13/2018 13*  . BUN/CREA Ratio 01/13/2018 12   . Glomerular Filtration Ra* 01/13/2018 >60   Hospital Outpatient Visit on 12/11/2017  Component Date Value  . POC Creatinine 12/11/2017 0.7   Office Visit on 11/28/2017  Component Date Value  . Color 11/28/2017 Yellow   . Clarity 11/28/2017 Clear   . Specific Gravity 11/28/2017 1.010   . pH, Urine 11/28/2017 6.5   . Protein, Urinalysis 11/28/2017 Negative   . Glucose, Urinalysis 11/28/2017 Negative   . Ketones, Urinalysis 11/28/2017 Negative   . Blood, Urinalysis 11/28/2017 Negative   . Nitrite, Urinalysis 11/28/2017 Negative   . Leukocytes, Urinalysis 11/28/2017 Negative   . Bilirubin, Urinalysis 11/28/2017 Negative   . Urobilinogen, Urinalysis 11/28/2017 0.2   . Red Blood Cells, Urinaly* 11/28/2017 1   . WBC, UA 11/28/2017 3   . Squamous Epithelial Cell* 11/28/2017 4   . Hyaline Casts 11/28/2017 2   . Bacteria, Urinalysis 11/28/2017 5-50*     ASSESSMENT: Ms. Schreur is a 52 y.o. female presenting for consultation for  cholelithiasis.    Patient was oriented  about the diagnosis of cholelithiasis. Also oriented about what is the gallbladder, its anatomy and function and the implications of having stones. The patient was oriented about the treatment alternatives (observation vs cholecystectomy). Patient was oriented that a low percentage of patient will continue to have similar pain symptmos even after the gallbladder is removed. Surgical technique (open vs laparoscopic) was discussed. It was also discussed the goals of the surgery (decrease the pain episodes and avoid the risk of cholecystitis) and the risk of surgery including: bleeding, infection, common bile duct injury, stone retention, injury to other organs such as bowel, liver, stomach, other complications such as hernia, bowel obstruction among others. Also discussed with patient about anesthesia and its complications such as: reaction to medications, pneumonia, heart complications, death, among others.  PLAN: 1. Laparoscopic cholecystectomy 47562 2. CBC, CMP 3. Internal Medicine clearance 4. Avoid fatty food 5. Avoid aspirin 5 days prior to surgery.   Patient verbalized understanding, all questions were answered, and were agreeable with the plan outlined above.   Herbert Pun, MD  Electronically signed by Herbert Pun, MD

## 2018-02-14 NOTE — H&P (View-Only) (Signed)
PATIENT PROFILE: Kaitlin Garner is a 52 y.o. female who presents to the Clinic for consultation at the request of Dr. Ellin Garner for evaluation of cholelithiasis.  PCP:  Kaitlin Gentile, MD  HISTORY OF PRESENT ILLNESS: Kaitlin Garner reports starting to feel pain on the right upper quadrant pain around 3 weeks ago. The patient refers started during the an admission for acute diverticulitis but it was mild and thought that was due to the same process of the diverticulitis. Pain has been increasing with time. Pain is localized to the right upper quadrant and does not radiates to other part of the body. Pain is aggravated with food, patient refers that is with any type of food. Has developed nausea but not all the time. Pain improve a little bit by itself with time but has not resolved completely. Denies episodes of fever or chills. Denies seeing her eyes yellow.    PROBLEM LIST:         Problem List  Date Reviewed: 02/08/2018         Noted   Renal mass 12/24/2017   Acquired hypothyroidism, unspecified 11/16/2015   Vitamin D deficiency, unspecified 11/16/2015   GERD (gastroesophageal reflux disease) 12/11/2013   Overview    Since 1985. EGD completed          GENERAL REVIEW OF SYSTEMS:   General ROS: negative for - chills, fatigue, fever, weight gain or weight loss Allergy and Immunology ROS: negative for - hives  Hematological and Lymphatic ROS: negative for - bleeding problems or bruising, negative for palpable nodes Endocrine ROS: negative for - heat or cold intolerance, hair changes Respiratory ROS: negative for - cough, shortness of breath or wheezing Cardiovascular ROS: no chest pain or palpitations GI ROS: negative for vomiting, abdominal pain, Positive for nausea, diarrhea and constipation Musculoskeletal ROS: negative for - joint swelling or muscle pain Neurological ROS: negative for - confusion, syncope Dermatological ROS: negative for pruritus Positive for  rash Psychiatric: negative for anxiety, depression, difficulty sleeping and memory loss  MEDICATIONS: CurrentMedications        Current Outpatient Medications  Medication Sig Dispense Refill  . ALPRAZolam (XANAX) 0.5 MG tablet Take by mouth    . EPINEPHrine (EPIPEN) 0.3 mg/0.3 mL pen injector Inject subcutaneously    . ergocalciferol, vitamin D2, 50,000 unit capsule Take 1 capsule (50,000 Units total) by mouth once a week 4 capsule 3  . levocetirizine (XYZAL) 5 MG tablet Take 1 tablet (5 mg total) by mouth every evening. For allergies 30 tablet 11  . levothyroxine (SYNTHROID, LEVOTHROID) 125 MCG tablet Take 1 tablet (125 mcg total) by mouth once daily 30 tablet 1  . pantoprazole (PROTONIX) 40 MG DR tablet Take 1 tablet (40 mg total) by mouth 2 (two) times daily 60 tablet 5  . PARoxetine (PAXIL-CR) 12.5 MG CR tablet Take 1 tablet (12.5 mg total) by mouth once daily For anxiety 30 tablet 1  . valACYclovir (VALTREX) 1000 MG tablet Take 2 tablets po 12 hours apart for cold sores 28 tablet 3  . diphenhydrAMINE (BENADRYL) 50 MG capsule Take one tablet one hour prior to your procedure (Patient not taking: Reported on 02/14/2018 ) 1 capsule 0  . predniSONE (DELTASONE) 50 MG tablet Take 50 mg by mouth 13 hours before your procedure, then take 50 mg 7 hours and then 50 mg  1 hour before procedure. (Patient not taking: Reported on 02/14/2018 ) 3 tablet 0   No current facility-administered medications for this visit.  ALLERGIES: Fish containing products; Lentils; Iodinated contrast- oral and iv dye; Latex; Nystatin; Pollen extracts; and Sulfur  PAST MEDICAL HISTORY:     Past Medical History:  Diagnosis Date  . Acquired hypothyroidism 11/16/2015  . Acquired hypothyroidism 11/16/2015  . Allergic state   . Arthritis   . Asthma without status asthmaticus   . Cough 03/09/2016  . Diverticulitis   . Dyspnea and respiratory abnormality 04/26/2016  . Eczema   . GERD (gastroesophageal  reflux disease)   . Hypothyroidism (acquired), unspecified   . Obesity, morbid, BMI 50 or higher (CMS-HCC)   . Poor intravenous access    "rolling veins"  . Reflux esophagitis 07/23/2016  . Vitamin D deficiency 11/16/2015    PAST SURGICAL HISTORY:      Past Surgical History:  Procedure Laterality Date  . BACK SURGERY  1995   Disc removal  . CESAREAN SECTION  11/19/1985  . CESAREAN SECTION  05/18/1992  . disc removed N/A   . EGD  07/23/2016   Reflux esophagitis/Gastritis/No Repeat/MUS  . KNEE ARTHROSCOPY  7/282016  . TONSILLECTOMY    . TUBAL LIGATION    . uterine ablation       FAMILY HISTORY:      Family History  Problem Relation Age of Onset  . Thyroid disease Mother   . Thyroid disease Sister   . Depression Sister   . No Known Problems Father   . Stroke Maternal Grandmother   . Thyroid disease Sister   . Depression Sister   . Thyroid disease Sister   . Thyroid disease Sister      SOCIAL HISTORY: Social History          Socioeconomic History  . Marital status: Single    Spouse name: Not on file  . Number of children: Not on file  . Years of education: Not on file  . Highest education level: Not on file  Occupational History  . Not on file  Social Needs  . Financial resource strain: Not on file  . Food insecurity:    Worry: Not on file    Inability: Not on file  . Transportation needs:    Medical: Not on file    Non-medical: Not on file  Tobacco Use  . Smoking status: Former Smoker    Types: Cigarettes    Last attempt to quit: 11/26/2006    Years since quitting: 11.2  . Smokeless tobacco: Never Used  Substance and Sexual Activity  . Alcohol use: Yes    Comment: occ  . Drug use: No  . Sexual activity: Yes    Partners: Male    Birth control/protection: Other-see comments    Comment: ablation and tubes tied  Other Topics Concern  . Would you please tell us about the people who live in your  home, your pets, or anything else important to your social life? No  Social History Narrative   Divorced   Daughter nearby and has a young granddaughter   Works at [employer] here from [place] in 2011      PHYSICAL EXAM:    Vitals:   02/14/18 1022  BP: (!) 138/99  Pulse: 79  Temp: 36.1 C (96.9 F)   Body mass index is 43.93 kg/m. Weight: (!) 123.4 kg (272 lb)   GENERAL: Alert, active, oriented x3  HEENT: Pupils equal reactive to light. Extraocular movements are intact. Sclera clear. Palpebral conjunctiva normal red color.Pharynx clear.  NECK: Supple with no palpable mass and  no adenopathy.  LUNGS: Sound clear with no rales rhonchi or wheezes.  HEART: Regular rhythm S1 and S2 without murmur.  ABDOMEN: Soft and depressible, nontender with no palpable mass, no hepatomegaly.   EXTREMITIES: Well-developed well-nourished symmetrical with no dependent edema.  NEUROLOGICAL: Awake alert oriented, facial expression symmetrical, moving all extremities.  REVIEW OF DATA: I have reviewed the following data today:      Office Visit on 01/13/2018  Component Date Value  . Color 01/13/2018 Yellow   . Clarity 01/13/2018 Clear   . Specific Gravity 01/13/2018 1.010   . pH, Urine 01/13/2018 5.5   . Protein, Urinalysis 01/13/2018 Negative   . Glucose, Urinalysis 01/13/2018 Negative   . Ketones, Urinalysis 01/13/2018 Negative   . Blood, Urinalysis 01/13/2018 Trace*  . Nitrite, Urinalysis 01/13/2018 Negative   . Leukocytes, Urinalysis 01/13/2018 Negative   . Bilirubin, Urinalysis 01/13/2018 Negative   . Urobilinogen, Urinalysis 01/13/2018 0.2   . WBC (White Blood Cell Co* 01/13/2018 9.2   . Hemoglobin 01/13/2018 13.0   . Hematocrit 01/13/2018 39.2   . Platelets 01/13/2018 382   . MCV (Mean Corpuscular Vo* 01/13/2018 85   . MCH (Mean Corpuscular He* 01/13/2018 28.3   . MCHC (Mean Corpuscular H* 01/13/2018 33.2   . RBC (Red Blood Cell Coun* 01/13/2018 4.60    . RDW-CV (Red Cell Distrib* 01/13/2018 14.1   . MPV (Mean Platelet Volum* 01/13/2018 9.8   . Neutrophil Count 01/13/2018 5.0   . Neutrophil % 01/13/2018 54.8   . Lymphocyte Count 01/13/2018 3.0   . Lymphocyte % 01/13/2018 32.9   . Monocyte Count 01/13/2018 0.7   . Monocyte % 01/13/2018 8.0   . Eosinophil Count 01/13/2018 0.38   . Eosinophil % 01/13/2018 4.1   . Basophil Count 01/13/2018 0.02   . Basophil % 01/13/2018 0.2   . Sodium 01/13/2018 137   . Potassium 01/13/2018 4.5   . Chloride 01/13/2018 101   . Carbon Dioxide (CO2) 01/13/2018 23   . Urea Nitrogen (BUN) 01/13/2018 11   . Creatinine 01/13/2018 0.9   . Glucose 01/13/2018 78   . Calcium 01/13/2018 9.6   . AST (Aspartate Aminotran* 01/13/2018 23   . ALT (Alanine Aminotransf* 01/13/2018 15   . Bilirubin, Total 01/13/2018 1.3   . Alk Phos (Alkaline Phosp* 01/13/2018 67   . Albumin 01/13/2018 3.8   . Protein, Total 01/13/2018 6.7   . Anion Gap 01/13/2018 13*  . BUN/CREA Ratio 01/13/2018 12   . Glomerular Filtration Ra* 01/13/2018 >60   Hospital Outpatient Visit on 12/11/2017  Component Date Value  . POC Creatinine 12/11/2017 0.7   Office Visit on 11/28/2017  Component Date Value  . Color 11/28/2017 Yellow   . Clarity 11/28/2017 Clear   . Specific Gravity 11/28/2017 1.010   . pH, Urine 11/28/2017 6.5   . Protein, Urinalysis 11/28/2017 Negative   . Glucose, Urinalysis 11/28/2017 Negative   . Ketones, Urinalysis 11/28/2017 Negative   . Blood, Urinalysis 11/28/2017 Negative   . Nitrite, Urinalysis 11/28/2017 Negative   . Leukocytes, Urinalysis 11/28/2017 Negative   . Bilirubin, Urinalysis 11/28/2017 Negative   . Urobilinogen, Urinalysis 11/28/2017 0.2   . Red Blood Cells, Urinaly* 11/28/2017 1   . WBC, UA 11/28/2017 3   . Squamous Epithelial Cell* 11/28/2017 4   . Hyaline Casts 11/28/2017 2   . Bacteria, Urinalysis 11/28/2017 5-50*     ASSESSMENT: Ms. Durrett is a 52 y.o. female presenting for consultation for  cholelithiasis.    Patient was oriented  about the diagnosis of cholelithiasis. Also oriented about what is the gallbladder, its anatomy and function and the implications of having stones. The patient was oriented about the treatment alternatives (observation vs cholecystectomy). Patient was oriented that a low percentage of patient will continue to have similar pain symptmos even after the gallbladder is removed. Surgical technique (open vs laparoscopic) was discussed. It was also discussed the goals of the surgery (decrease the pain episodes and avoid the risk of cholecystitis) and the risk of surgery including: bleeding, infection, common bile duct injury, stone retention, injury to other organs such as bowel, liver, stomach, other complications such as hernia, bowel obstruction among others. Also discussed with patient about anesthesia and its complications such as: reaction to medications, pneumonia, heart complications, death, among others.  PLAN: 1. Laparoscopic cholecystectomy 47562 2. CBC, CMP 3. Internal Medicine clearance 4. Avoid fatty food 5. Avoid aspirin 5 days prior to surgery.   Patient verbalized understanding, all questions were answered, and were agreeable with the plan outlined above.   Herbert Pun, MD  Electronically signed by Herbert Pun, MD

## 2018-02-17 ENCOUNTER — Other Ambulatory Visit: Payer: Self-pay

## 2018-02-17 ENCOUNTER — Encounter
Admission: RE | Admit: 2018-02-17 | Discharge: 2018-02-17 | Disposition: A | Discharge status: Home / Self Care | Payer: BC Managed Care – PPO | Source: Ambulatory Visit | Attending: General Surgery | Admitting: General Surgery

## 2018-02-17 DIAGNOSIS — M79672 Pain in left foot: Secondary | ICD-10-CM

## 2018-02-17 HISTORY — DX: Pain in left foot: M79.672

## 2018-02-17 HISTORY — DX: Personal history of other diseases of the digestive system: Z87.19

## 2018-02-17 HISTORY — DX: Unspecified osteoarthritis, unspecified site: M19.90

## 2018-02-17 HISTORY — DX: Morbid (severe) obesity due to excess calories: E66.01

## 2018-02-17 HISTORY — DX: Gastro-esophageal reflux disease without esophagitis: K21.9

## 2018-02-17 NOTE — Patient Instructions (Signed)
Your procedure is scheduled on: 02-20-18 Report to Same Day Surgery 2nd floor medical mall [facility](Medical Mall Entrance-take elevator on left to 2nd floor.  Check in with surgery information desk.) To find out your arrival time please call [phone] between 1PM - 3PM on 02-19-18   Remember: Instructions that are not followed completely may result in serious medical risk, up to and including death, or upon the discretion of your surgeon and anesthesiologist your surgery may need to be rescheduled.    _x___ 1. Do not eat food after midnight the night before your procedure. NO GUM OR CANDY AFTER MIDNIGHT.  You may drink clear liquids up to 2 hours before you are scheduled to arrive at the hospital for your procedure.  Do not drink clear liquids within 2 hours of your scheduled arrival to the hospital.  Clear liquids include  --Water or Apple juice without pulp  --Clear carbohydrate beverage such as ClearFast or Gatorade  --Black Coffee or Clear Tea (No milk, no creamers, do not add anything to the coffee or Tea    __x__ 2. No Alcohol for 24 hours before or after surgery.   __x__3. No Smoking or e-cigarettes for 24 prior to surgery.  Do not use any chewable tobacco products for at least 6 hour prior to surgery   ____  4. Bring all medications with you on the day of surgery if instructed.    __x__ 5. Notify your doctor if there is any change in your medical condition     (cold, fever, infections).    x___6. On the morning of surgery brush your teeth with toothpaste and water.  You may rinse your mouth with mouth wash if you wish.  Do not swallow any toothpaste or mouthwash.   Do not wear jewelry, make-up, hairpins, clips or nail polish.  Do not wear lotions, powders, or perfumes. You may wear deodorant.  Do not shave 48 hours prior to surgery. Men may shave face and neck.  Do not bring valuables to the hospital.    [facility] is not responsible for any belongings or valuables.    Contacts, dentures or bridgework may not be worn into surgery.  Leave your suitcase in the car. After surgery it may be brought to your room.  For patients admitted to the hospital, discharge time is determined by your treatment team.  _  Patients discharged the day of surgery will not be allowed to drive home.  You will need someone to drive you home and stay with you the night of your procedure.     _x___ TAKE THE FOLLOWING MEDICATION THE MORNING OF SURGERY WITH A SMALL SIP OF WATER. These include:  1. LEVOTHYROXINE  2. PROTONIX  3. YOU MAY TAKE YOUR XANAX IF NEEDED AM OF SURGERY WITH A SMALL SIP OF WATER  4.  5.  6.  ____Fleets enema or Magnesium Citrate as directed.   ____ Use CHG Soap or sage wipes as directed on instruction sheet   _X___ Use inhalers on the day of surgery and bring to hospital day of surgery-BRING YOUR ALBUTEROL INHALER TO HOSPITAL  ____ Stop Metformin and Janumet 2 days prior to surgery.    ____ Take 1/2 of usual insulin dose the night before surgery and none on the morning surgery.   ____ Follow recommendations from Cardiologist, Pulmonologist or PCP regarding stopping Aspirin, Coumadin, Plavix ,Eliquis, Effient, or Pradaxa, and Pletal.  X____Stop Anti-inflammatories such as Advil, Aleve, Ibuprofen, Motrin, Naproxen, Naprosyn, Goodies powders or  aspirin products. OK to take Tylenol OR TYLENOL # 3 IF NEEDED   ____ Stop supplements until after surgery.     ____ Bring C-Pap to the hospital.

## 2018-02-17 NOTE — Pre-Procedure Instructions (Signed)
NM myocardial perfusion SPECT multiple (stress and rest)03/19/2016 [facility] Result Impression   Normal myocardial perfusion scan no evidence of stress-induced  myocardial ischemia ejection fraction of 69% conclusion negative scan  Result Narrative  CARDIOLOGY DEPARTMENT [facility] [address], [place], [id number] [phone]  Procedure: Pharmacologic Myocardial Perfusion Imaging TWO day procedure  Indication: SOB (shortness of breath) Plan: NM myocardial perfusion SPECT multiple (stress  and rest), ECG stress test only  Angina at rest (CMS-HCC) Plan: NM myocardial perfusion SPECT multiple (stress  and rest), ECG stress test only  Fatigue, unspecified type Plan: NM myocardial perfusion SPECT multiple (stress  and rest), ECG stress test only  Ordering Physician:   Dr. Dorothyann Pengwayne Callwood   Clinical History: 52 y.o. year old female recent anginal symptoms Vitals: Height: 65 in Weight: 320 lb Cardiac risk factors include:  Obesity    Procedure:  Pharmacologic stress testing was performed with Regadenoson using a single  use 0.4mg /335ml (0.08 mg/ml) prefilled syringe intravenously infused as a  bolus dose. The stress test was stopped due to Infusion completion.Blood  pressure response was normal.   Rest HR: 81bpm Rest BP: 142/3990mmHg Max HR: 115bpm Min BP: 150/384mmHg  Stress Test Administered by: Percell BostonINA WALLACE, CMA  ECG Interpretation: Rest KVQ:QVZDGLECG:normal sinus rhythm, none Stress OVF:IEPPIRECG:normal sinus rhythm,  Recovery JJO:ACZYSAECG:normal sinus rhythm ECG Interpretation:non-diagnostic due to pharmacologic testing.   Administrations This Visit  regadenoson (LEXISCAN) 0.4 mg/5 mL inj syringe 0.4 mg  Admin Date Action Dose Route Administered By      03/16/2016 Given 0.4 mg Intravenous Vivia BirminghamWilliam M Downs, Patient Care Tech       technetium Tc4519m sestamibi (CARDIOLITE)  injection 33.61 millicurie  Admin Date Action Dose Route Administered By      03/19/2016 Given 33.61 millicurie Intravenous Joshua B Hyler, CNMT      technetium Tc1219m sestamibi (CARDIOLITE) injection 35.62 millicurie  Admin Date Action Dose Route Administered By      03/16/2016 Given 35.62 millicurie Intravenous Vivia BirminghamWilliam M Downs, Patient  Care Tech        Gated post-stress perfusion imaging was performed 30 minutes after stress.  Rest images were performed 30 minutes after injection.  Gated LV Analysis:  TID Ratio: 0.95  LVEF= 69 %  FINDINGS: Regional wall motion:reveals normal myocardial thickening and wall  motion. The overall quality of the study is good. Artifacts noted: no Left ventricular cavity: normal.  Perfusion Analysis:SPECT images demonstrate homogeneous tracer  distribution throughout the myocardium.  Status Results Details   Unavailable

## 2018-02-17 NOTE — Pre-Procedure Instructions (Signed)
Kaitlin Garner  CARDIAC CATHETERIZATION  Order# [id number]  Reading physician: Alwyn Peaallwood, Dwayne D, MD Ordering physician: Alwyn Peaallwood, Dwayne D, MD Study date: 05/10/16  Physicians   Panel Physicians Referring Physician Case Authorizing Physician  Alwyn Peaallwood, Dwayne D, MD (Primary)    Procedures   Right/Left Heart Cath and Coronary Angiography  Conclusion    normal cardiac catheterization with right and left heart study is completely normal  The left ventricular systolic function is normal.    Complications   Complications documented in old activity   Right and left heart catheter from right groin 5 French catheter from right femoral artery 7 [place]French for right femoral vein. Kaitlin Garner was floated to the pulmonary system which showed normal pulmonary pressures. Left coronary system was performed with 5 French catheters. Pigtail in the left ventricle for LV gram suggested normal left ventricular function no valvular disease. Diagnostic left 4 curved left Judkins was used to access the left coronary system which was free of disease. The right coronary system was accessed with a 4 curve right Judkins catheter which was totally free of disease. Catheters and sheaths were removed minx deployed in the right femoral artery. PA sat was 80% arterial sat was 97%. Total sedation time was 21 minutes. Patient received 2 mg of Versed and 50 g of fentanyl. Patient tolerated the procedure well now complicationEstimated blood loss <50 mL. There were no immediate complications during the procedure. Conclusion  Successful right and left heart catheter from right groin normal right-sided pressures and normal left coronary disease normal left ventricular function  Basically normal cardiac catheterization      Coronary Findings   Diagnostic  Dominance: Co-dominant  No diagnostic findings have been documented.  Intervention   No interventions have been documented.  Right Heart   Right Heart Pressures LV EDP is  normal. Normal right heart catheter with normal pressures Cardiac output was 7.74 cardiac index 3.25  Wall Motion      Normal overall left ventricular function ejection fraction greater than 55%          Left Heart   Left Ventricle The left ventricular size is normal. The left ventricular systolic function is normal. The ejection fraction is calculated to be 55%. The left ventricular ejection fraction is 55-65% by visual estimate. Ejection fraction quantitative: 55%. There are no wall motion abnormalities in the left ventricle.  Coronary Diagrams   Diagnostic Diagram       Implants    Vascular Products  Device Closure Mynxgrip [id number] - Implanted    Inventory item: DEVICE CLOSURE MYNXGRIP 13F Model/Cat number: [id number]  Manufacturer: [employer] Lot number: [id number]  Device identifier: [id number] Device identifier type: HIBC  Area Of Implantation: Femoral Artery    As of 05/10/2016   Status: Implanted      MERGE Images   Show images for Cardiac catheterization   Link to Procedure Log   Procedure Log    Hemo Data 05/10/16 1436--05/10/16 1613 before discharge   AO Systolic Cath Pressure AO Diastolic Cath Pressure AO Mean Cath Pressure LV Systolic Cath Pressure LV End Diastolic PA Systolic Cath Pressure PA Diastolic Cath Pressure PA Mean Cath Pressure RV Systolic Cath Pressure RV Diastolic Cath Pressure RV End Diastolic AO O2 Sat PA O2 Sat AO O2 Sat  115 67 mmHg 87 mmHg - - - - - - - - - - -  - - - 124 mmHg 11 mmHg - - - - - - - - -  - - -  120 mmHg 15 mmHg - - - - - - - - -  - - - 103 mmHg 13 mmHg - - - - - - - - -  - - - - - 36 mmHg 21 mmHg 28 mmHg - - - - - -  - - - - - - - - 35 mmHg 11 mmHg 16 mmHg - - -  - - - 129 mmHg 12 mmHg - - - - - - - - -  130 70 mmHg 98 mmHg - - - - - - - - - - -  - - - - - - - - - - - 97.4 % - SA  - - - - - - - - - - - - MV -  Order-Level Documents:   There are no order-level documents.  Encounter-Level Documents - 05/10/2016:     Scan on 05/16/2016 1:17 PM by Default, Provider, MD  Scan on 05/16/2016 12:50 PM by Default, Provider, MD  Scan on 05/16/2016 12:45 PM by Default, Provider, MD  Scan on 05/16/2016 12:39 PM by Default, Provider, MD  Scan on 05/16/2016 12:36 PM by Default, Provider, MD  Scan on 05/10/2016 4:13 PM by Default, Provider, MD  Electronic signature on 05/10/2016 11:24 AM - Signed      Signed   Electronically signed by Alwyn Pea, MD on 05/11/16 at 1647 EDT  External Result Report   External Result Report

## 2018-02-19 MED ORDER — DEXTROSE 5 % IV SOLN
3.0000 g | INTRAVENOUS | Status: AC
  Filled 2018-02-19: qty 3000

## 2018-02-20 ENCOUNTER — Encounter: Payer: Self-pay | Admitting: *Deleted

## 2018-02-20 ENCOUNTER — Ambulatory Visit: Payer: BC Managed Care – PPO | Admitting: Anesthesiology

## 2018-02-20 ENCOUNTER — Ambulatory Visit
Admission: RE | Admit: 2018-02-20 | Discharge: 2018-02-20 | Disposition: A | Discharge status: Home / Self Care | Payer: BC Managed Care – PPO | Source: Ambulatory Visit | Attending: General Surgery | Admitting: General Surgery

## 2018-02-20 ENCOUNTER — Encounter
Admission: RE | Disposition: A | Discharge status: Home / Self Care | Payer: Self-pay | Source: Ambulatory Visit | Attending: General Surgery

## 2018-02-20 DIAGNOSIS — K8010 Calculus of gallbladder with chronic cholecystitis without obstruction: Secondary | ICD-10-CM | POA: Diagnosis not present

## 2018-02-20 DIAGNOSIS — E559 Vitamin D deficiency, unspecified: Secondary | ICD-10-CM | POA: Diagnosis not present

## 2018-02-20 DIAGNOSIS — J45909 Unspecified asthma, uncomplicated: Secondary | ICD-10-CM | POA: Diagnosis not present

## 2018-02-20 DIAGNOSIS — E039 Hypothyroidism, unspecified: Secondary | ICD-10-CM | POA: Diagnosis not present

## 2018-02-20 DIAGNOSIS — Z87891 Personal history of nicotine dependence: Secondary | ICD-10-CM | POA: Diagnosis not present

## 2018-02-20 DIAGNOSIS — K219 Gastro-esophageal reflux disease without esophagitis: Secondary | ICD-10-CM | POA: Diagnosis not present

## 2018-02-20 DIAGNOSIS — Z79899 Other long term (current) drug therapy: Secondary | ICD-10-CM | POA: Diagnosis not present

## 2018-02-20 DIAGNOSIS — Z8719 Personal history of other diseases of the digestive system: Secondary | ICD-10-CM | POA: Insufficient documentation

## 2018-02-20 DIAGNOSIS — F419 Anxiety disorder, unspecified: Secondary | ICD-10-CM | POA: Insufficient documentation

## 2018-02-20 DIAGNOSIS — Z91013 Allergy to seafood: Secondary | ICD-10-CM | POA: Diagnosis not present

## 2018-02-20 DIAGNOSIS — K8020 Calculus of gallbladder without cholecystitis without obstruction: Secondary | ICD-10-CM | POA: Diagnosis present

## 2018-02-20 DIAGNOSIS — Z882 Allergy status to sulfonamides status: Secondary | ICD-10-CM | POA: Insufficient documentation

## 2018-02-20 DIAGNOSIS — Z881 Allergy status to other antibiotic agents status: Secondary | ICD-10-CM | POA: Diagnosis not present

## 2018-02-20 DIAGNOSIS — Z6841 Body Mass Index (BMI) 40.0 and over, adult: Secondary | ICD-10-CM | POA: Insufficient documentation

## 2018-02-20 DIAGNOSIS — Z91041 Radiographic dye allergy status: Secondary | ICD-10-CM | POA: Diagnosis not present

## 2018-02-20 DIAGNOSIS — Z888 Allergy status to other drugs, medicaments and biological substances status: Secondary | ICD-10-CM | POA: Diagnosis not present

## 2018-02-20 DIAGNOSIS — Z91040 Latex allergy status: Secondary | ICD-10-CM | POA: Diagnosis not present

## 2018-02-20 DIAGNOSIS — M1990 Unspecified osteoarthritis, unspecified site: Secondary | ICD-10-CM | POA: Diagnosis not present

## 2018-02-20 HISTORY — PX: CHOLECYSTECTOMY: SHX55

## 2018-02-20 SURGERY — LAPAROSCOPIC CHOLECYSTECTOMY
Anesthesia: General | Wound class: Clean Contaminated

## 2018-02-20 MED ORDER — PROMETHAZINE HCL 25 MG/ML IJ SOLN
INTRAMUSCULAR | Status: AC
  Administered 2018-02-20: 6.25 mg via INTRAVENOUS
  Filled 2018-02-20: qty 1

## 2018-02-20 MED ORDER — TRAMADOL HCL 50 MG PO TABS: 50.0000 mg | ORAL_TABLET | Freq: Four times a day (QID) | ORAL | 0 refills | Status: AC | PRN

## 2018-02-20 MED ORDER — LIDOCAINE HCL (PF) 2 % IJ SOLN
INTRAMUSCULAR | Status: AC
  Filled 2018-02-20: qty 10

## 2018-02-20 MED ORDER — ONDANSETRON HCL 4 MG/2ML IJ SOLN
INTRAMUSCULAR | Status: DC | PRN
  Administered 2018-02-20: 4 mg via INTRAVENOUS

## 2018-02-20 MED ORDER — ACETAMINOPHEN 10 MG/ML IV SOLN
INTRAVENOUS | Status: DC | PRN
  Administered 2018-02-20: 1000 mg via INTRAVENOUS

## 2018-02-20 MED ORDER — ROCURONIUM BROMIDE 50 MG/5ML IV SOLN
INTRAVENOUS | Status: AC
  Filled 2018-02-20: qty 1

## 2018-02-20 MED ORDER — LORAZEPAM 2 MG/ML IJ SOLN: 1.0000 mg | Freq: Once | INTRAMUSCULAR | Status: DC | PRN

## 2018-02-20 MED ORDER — BUPIVACAINE-EPINEPHRINE (PF) 0.5% -1:200000 IJ SOLN
INTRAMUSCULAR | Status: AC
  Filled 2018-02-20: qty 30

## 2018-02-20 MED ORDER — SODIUM CHLORIDE FLUSH 0.9 % IV SOLN
INTRAVENOUS | Status: AC
  Filled 2018-02-20: qty 10

## 2018-02-20 MED ORDER — DEXAMETHASONE SODIUM PHOSPHATE 10 MG/ML IJ SOLN
INTRAMUSCULAR | Status: DC | PRN
  Administered 2018-02-20: 10 mg via INTRAVENOUS

## 2018-02-20 MED ORDER — HYDROMORPHONE HCL 1 MG/ML IJ SOLN
0.2500 mg | INTRAMUSCULAR | Status: DC | PRN
  Administered 2018-02-20 (×4): 0.5 mg via INTRAVENOUS

## 2018-02-20 MED ORDER — HYDROMORPHONE HCL 1 MG/ML IJ SOLN
INTRAMUSCULAR | Status: AC
  Administered 2018-02-20: 0.5 mg via INTRAVENOUS
  Filled 2018-02-20: qty 1

## 2018-02-20 MED ORDER — OXYCODONE HCL 5 MG PO TABS
5.0000 mg | ORAL_TABLET | Freq: Once | ORAL | Status: AC | PRN
  Administered 2018-02-20: 5 mg via ORAL

## 2018-02-20 MED ORDER — ACETAMINOPHEN 10 MG/ML IV SOLN
INTRAVENOUS | Status: AC
  Filled 2018-02-20: qty 100

## 2018-02-20 MED ORDER — PROPOFOL 10 MG/ML IV BOLUS
INTRAVENOUS | Status: AC
  Filled 2018-02-20: qty 40

## 2018-02-20 MED ORDER — PROPOFOL 10 MG/ML IV BOLUS
INTRAVENOUS | Status: DC | PRN
  Administered 2018-02-20: 200 mg via INTRAVENOUS

## 2018-02-20 MED ORDER — OXYCODONE HCL 5 MG PO TABS
ORAL_TABLET | ORAL | Status: AC
  Filled 2018-02-20: qty 1

## 2018-02-20 MED ORDER — LIDOCAINE HCL (CARDIAC) 20 MG/ML IV SOLN
INTRAVENOUS | Status: DC | PRN
  Administered 2018-02-20: 100 mg via INTRAVENOUS

## 2018-02-20 MED ORDER — CEFAZOLIN SODIUM-DEXTROSE 2-4 GM/100ML-% IV SOLN
INTRAVENOUS | Status: AC
  Filled 2018-02-20: qty 100

## 2018-02-20 MED ORDER — FENTANYL CITRATE (PF) 100 MCG/2ML IJ SOLN
INTRAMUSCULAR | Status: DC | PRN
  Administered 2018-02-20: 50 ug via INTRAVENOUS
  Administered 2018-02-20: 100 ug via INTRAVENOUS
  Administered 2018-02-20: 50 ug via INTRAVENOUS

## 2018-02-20 MED ORDER — MIDAZOLAM HCL 2 MG/2ML IJ SOLN
INTRAMUSCULAR | Status: AC
  Filled 2018-02-20: qty 2

## 2018-02-20 MED ORDER — LACTATED RINGERS IV SOLN
INTRAVENOUS | Status: DC
  Administered 2018-02-20: 10:00:00 via INTRAVENOUS

## 2018-02-20 MED ORDER — ROCURONIUM BROMIDE 100 MG/10ML IV SOLN
INTRAVENOUS | Status: DC | PRN
  Administered 2018-02-20 (×3): 10 mg via INTRAVENOUS
  Administered 2018-02-20: 30 mg via INTRAVENOUS

## 2018-02-20 MED ORDER — SUCCINYLCHOLINE CHLORIDE 20 MG/ML IJ SOLN
INTRAMUSCULAR | Status: AC
  Filled 2018-02-20: qty 1

## 2018-02-20 MED ORDER — FENTANYL CITRATE (PF) 250 MCG/5ML IJ SOLN
INTRAMUSCULAR | Status: AC
  Filled 2018-02-20: qty 5

## 2018-02-20 MED ORDER — SUGAMMADEX SODIUM 200 MG/2ML IV SOLN
INTRAVENOUS | Status: AC
  Filled 2018-02-20: qty 4

## 2018-02-20 MED ORDER — SUGAMMADEX SODIUM 200 MG/2ML IV SOLN
INTRAVENOUS | Status: DC | PRN
  Administered 2018-02-20: 300 mg via INTRAVENOUS

## 2018-02-20 MED ORDER — MEPERIDINE HCL 50 MG/ML IJ SOLN: 6.2500 mg | INTRAMUSCULAR | Status: DC | PRN

## 2018-02-20 MED ORDER — BUPIVACAINE-EPINEPHRINE (PF) 0.5% -1:200000 IJ SOLN
INTRAMUSCULAR | Status: DC | PRN
  Administered 2018-02-20: 16 mL via PERINEURAL

## 2018-02-20 MED ORDER — PROMETHAZINE HCL 25 MG/ML IJ SOLN
6.2500 mg | INTRAMUSCULAR | Status: DC | PRN
  Administered 2018-02-20: 6.25 mg via INTRAVENOUS

## 2018-02-20 MED ORDER — ONDANSETRON HCL 4 MG/2ML IJ SOLN
INTRAMUSCULAR | Status: AC
  Filled 2018-02-20: qty 2

## 2018-02-20 MED ORDER — MIDAZOLAM HCL 2 MG/2ML IJ SOLN
INTRAMUSCULAR | Status: DC | PRN
  Administered 2018-02-20 (×2): 1 mg via INTRAVENOUS
  Administered 2018-02-20: 2 mg via INTRAVENOUS

## 2018-02-20 MED ORDER — OXYCODONE HCL 5 MG/5ML PO SOLN: 5.0000 mg | Freq: Once | ORAL | Status: AC | PRN

## 2018-02-20 MED ORDER — SUCCINYLCHOLINE CHLORIDE 20 MG/ML IJ SOLN
INTRAMUSCULAR | Status: DC | PRN
  Administered 2018-02-20: 140 mg via INTRAVENOUS

## 2018-02-20 MED ORDER — DEXAMETHASONE SODIUM PHOSPHATE 10 MG/ML IJ SOLN
INTRAMUSCULAR | Status: AC
  Filled 2018-02-20: qty 1

## 2018-02-20 SURGICAL SUPPLY — 35 items
APPLIER CLIP LOGIC TI 5 (MISCELLANEOUS) ×3 IMPLANT
BLADE SURG SZ11 CARB STEEL (BLADE) ×3 IMPLANT
CANISTER SUCT 1200ML W/VALVE (MISCELLANEOUS) ×3 IMPLANT
CHLORAPREP W/TINT 26ML (MISCELLANEOUS) ×3 IMPLANT
DERMABOND ADVANCED (GAUZE/BANDAGES/DRESSINGS) ×2
DERMABOND ADVANCED .7 DNX12 (GAUZE/BANDAGES/DRESSINGS) ×1 IMPLANT
DRAPE SHEET LG 3/4 BI-LAMINATE (DRAPES) ×3 IMPLANT
ELECT REM PT RETURN 9FT ADLT (ELECTROSURGICAL) ×3
ELECTRODE REM PT RTRN 9FT ADLT (ELECTROSURGICAL) ×1 IMPLANT
GLOVE BIO SURGEON STRL SZ 6.5 (GLOVE) ×2 IMPLANT
GLOVE BIO SURGEONS STRL SZ 6.5 (GLOVE) ×1
GOWN STRL REUS W/ TWL LRG LVL3 (GOWN DISPOSABLE) ×4 IMPLANT
GOWN STRL REUS W/TWL LRG LVL3 (GOWN DISPOSABLE) ×8
GRASPER SUT TROCAR 14GX15 (MISCELLANEOUS) IMPLANT
HEMOSTAT SURGICEL 2X3 (HEMOSTASIS) IMPLANT
IRRIGATION STRYKERFLOW (MISCELLANEOUS) ×1 IMPLANT
IRRIGATOR STRYKERFLOW (MISCELLANEOUS) ×3
IV NS 1000ML (IV SOLUTION) ×2
IV NS 1000ML BAXH (IV SOLUTION) ×1 IMPLANT
KIT TURNOVER KIT A (KITS) ×3 IMPLANT
LABEL OR SOLS (LABEL) ×3 IMPLANT
NEEDLE HYPO 25X1 1.5 SAFETY (NEEDLE) ×3 IMPLANT
NEEDLE INSUFFLATION 14GA 120MM (NEEDLE) ×3 IMPLANT
NS IRRIG 500ML POUR BTL (IV SOLUTION) ×3 IMPLANT
PACK LAP CHOLECYSTECTOMY (MISCELLANEOUS) ×3 IMPLANT
PENCIL ELECTRO HAND CTR (MISCELLANEOUS) IMPLANT
POUCH SPECIMEN RETRIEVAL 10MM (ENDOMECHANICALS) ×3 IMPLANT
SCISSORS METZENBAUM CVD 33 (INSTRUMENTS) ×3 IMPLANT
SLEEVE ENDOPATH XCEL 5M (ENDOMECHANICALS) ×6 IMPLANT
SUT MNCRL AB 4-0 PS2 18 (SUTURE) ×3 IMPLANT
SUT VIC AB 0 CT1 36 (SUTURE) IMPLANT
SUT VICRYL 0 AB UR-6 (SUTURE) ×3 IMPLANT
TROCAR XCEL NON-BLD 11X100MML (ENDOMECHANICALS) ×3 IMPLANT
TROCAR XCEL NON-BLD 5MMX100MML (ENDOMECHANICALS) ×3 IMPLANT
TUBING INSUFFLATION (TUBING) ×3 IMPLANT

## 2018-02-20 NOTE — Anesthesia Post-op Follow-up Note (Signed)
Anesthesia QCDR form completed.        

## 2018-02-20 NOTE — Discharge Instructions (Addendum)
°  Diet: Resume home heart healthy regular diet.   Activity: No heavy lifting >20 pounds (children, pets, laundry, garbage) or strenuous activity until follow-up, but light activity and walking are encouraged. Do not drive or drink alcohol if taking narcotic pain medications.  Wound care: Remove dressing before next shower. May shower with soapy water and pat dry (do not rub incisions), but no baths or submerging incision underwater until follow-up. (no swimming)   Medications: Resume all home medications. For mild to moderate pain: acetaminophen (Tylenol) or ibuprofen (if no kidney disease). Combining Tylenol with alcohol can substantially increase your risk of causing liver disease. Narcotic pain medications, if prescribed, can be used for severe pain, though may cause nausea, constipation, and drowsiness. Do not combine Tylenol and Percocet within a 6 hour period as Percocet contains Tylenol. If you do not need the narcotic pain medication, you do not need to fill the prescription.  Call office [phone]) at any time if any questions, worsening pain, fevers/chills, bleeding, drainage from incision site, or other concerns.  AMBULATORY SURGERY  DISCHARGE INSTRUCTIONS   1) The drugs that you were given will stay in your system until tomorrow so for the next 24 hours you should not:  A) Drive an automobile B) Make any legal decisions C) Drink any alcoholic beverage   2) You may resume regular meals tomorrow.  Today it is better to start with liquids and gradually work up to solid foods.  You may eat anything you prefer, but it is better to start with liquids, then soup and crackers, and gradually work up to solid foods.   3) Please notify your doctor immediately if you have any unusual bleeding, trouble breathing, redness and pain at the surgery site, drainage, fever, or pain not relieved by medication.    4) Additional Instructions:        Please contact your physician with  any problems or Same Day Surgery at [phone], Monday through Friday 6 am to 4 pm, or [place] at [facility] Main number at [phone].    AMBULATORY SURGERY  DISCHARGE INSTRUCTIONS   5) The drugs that you were given will stay in your system until tomorrow so for the next 24 hours you should not:  D) Drive an automobile E) Make any legal decisions F) Drink any alcoholic beverage   6) You may resume regular meals tomorrow.  Today it is better to start with liquids and gradually work up to solid foods.  You may eat anything you prefer, but it is better to start with liquids, then soup and crackers, and gradually work up to solid foods.   7) Please notify your doctor immediately if you have any unusual bleeding, trouble breathing, redness and pain at the surgery site, drainage, fever, or pain not relieved by medication.    8) Additional Instructions:        Please contact your physician with any problems or Same Day Surgery at [phone], Monday through Friday 6 am to 4 pm, or [place] at [facility] Main number at [phone].

## 2018-02-20 NOTE — Anesthesia Preprocedure Evaluation (Addendum)
Anesthesia Evaluation  Patient identified by MRN, date of birth, ID band Patient awake    Reviewed: Allergy & Precautions, H&P , NPO status , reviewed documented beta blocker date and time   Airway Mallampati: II  TM Distance: >3 FB     Dental  (+) Chipped   Pulmonary asthma , former smoker,    Pulmonary exam normal        Cardiovascular Normal cardiovascular exam  Normal EF, normal cath 2017   Neuro/Psych PSYCHIATRIC DISORDERS Anxiety    GI/Hepatic hiatal hernia, GERD  Medicated and Controlled,  Endo/Other  Hypothyroidism   Renal/GU      Musculoskeletal  (+) Arthritis , Osteoarthritis,    Abdominal   Peds  Hematology   Anesthesia Other Findings   Reproductive/Obstetrics                            Anesthesia Physical Anesthesia Plan  ASA: III  Anesthesia Plan: General ETT   Post-op Pain Management:    Induction:   PONV Risk Score and Plan: 3 and Ondansetron, Midazolam, Dexamethasone and Promethazine  Airway Management Planned:   Additional Equipment:   Intra-op Plan:   Post-operative Plan:   Informed Consent: I have reviewed the patients History and Physical, chart, labs and discussed the procedure including the risks, benefits and alternatives for the proposed anesthesia with the patient or authorized representative who has indicated his/her understanding and acceptance.   Dental Advisory Given  Plan Discussed with: CRNA  Anesthesia Plan Comments:         Anesthesia Quick Evaluation

## 2018-02-20 NOTE — Interval H&P Note (Signed)
History and Physical Interval Note:  02/20/2018 10:07 AM  [name]  has presented today for surgery, with the diagnosis of CALCULUS OF GALLBLADDER  The various methods of treatment have been discussed with the patient and family. After consideration of risks, benefits and other options for treatment, the patient has consented to  Procedure(s): LAPAROSCOPIC CHOLECYSTECTOMY (N/A) as a surgical intervention .  The patient's history has been reviewed, patient examined, no change in status, stable for surgery.  I have reviewed the patient's chart and labs.  Questions were answered to the patient's satisfaction.     Carolan ShiverEdgardo Cintron-Diaz

## 2018-02-20 NOTE — Brief Op Note (Signed)
02/20/2018  12:30 PM  PATIENT:  Kaitlin Garner  52 y.o. female  PRE-OPERATIVE DIAGNOSIS:  CALCULUS OF GALLBLADDER  POST-OPERATIVE DIAGNOSIS:  CALCULUS OF GALLBLADDER  PROCEDURE:  Procedure(s): LAPAROSCOPIC CHOLECYSTECTOMY (N/A)  SURGEON:  Surgeon(s) and Role:    * Carolan Shiverintron-Diaz, Oley Lahaie, MD - Primary  EBL:  10mL

## 2018-02-20 NOTE — Op Note (Signed)
Preoperative diagnosis: Symptomatic cholelithiasis  Postoperative diagnosis: Symptomatic cholelithiasis.  Procedure: Laparoscopic Cholecystectomy.   Anesthesia: GETA   Surgeon: Dr. Hazle Quantintron Diaz  Wound Classification: Clean Contaminated  Indications: Patient is a 52 y.o. female developed right upper quadrant pain, nausea and vomiting associated with food intake and on workup was found to have cholelithiasis with a normal common duct. Laparoscopic cholecystectomy was elected.  Findings: Critical view of safety achieved Cystic duct and artery identified, ligated and divided Adequate hemostasis  Description of procedure: The patient was placed on the operating table in the supine position. General anesthesia was induced. A time-out was completed verifying correct patient, procedure, site, positioning, and implant(s) and/or special equipment prior to beginning this procedure. An orogastric tube was placed. The abdomen was prepped and draped in the usual sterile fashion.  An incision was made in a natural skin line below the umbilicus.  The fascia was elevated and the Veress needle inserted. Proper position was confirmed by aspiration and saline meniscus test.  The abdomen was insufflated with carbon dioxide to a pressure of 15 mmHg. The patient tolerated insufflation well. A 11-mm trocar was then inserted.  The laparoscope was inserted and the abdomen inspected. No injuries from initial trocar placement were noted. Additional trocars were then inserted in the following locations: a 5-mm trocar in the right epigastrium and two 5-mm trocars along the right costal margin. The abdomen was inspected and no abnormalities were found. The table was placed in the reverse Trendelenburg position with the right side up.  Filmy adhesions between the gallbladder and omentum, duodenum and transverse colon were lysed sharply. The dome of the gallbladder was grasped with an atraumatic grasper passed through the  lateral port and retracted over the dome of the liver. The infundibulum was also grasped with an atraumatic grasper through the midclavicular port and retracted toward the right lower quadrant. This maneuver exposed Calot's triangle. The peritoneum overlying the gallbladder infundibulum was then incised and the cystic duct and cystic artery identified and circumferentially dissected. A time out was performed to review the critical view of safety structures. The cystic duct and cystic artery were then doubly clipped and divided close to the gallbladder.  The gallbladder was then dissected from its peritoneal attachments by electrocautery. Hemostasis was checked and the gallbladder and contained stones were removed using an endoscopic retrieval bag placed through the umbilical port. The gallbladder was passed off the table as a specimen. The gallbladder fossa was copiously irrigated with saline and hemostasis was obtained. There was no evidence of bleeding from the gallbladder fossa or cystic artery or leakage of the bile from the cystic duct stump. Secondary trocars were removed under direct vision. No bleeding was noted. The laparoscope was withdrawn and the umbilical trocar removed. The abdomen was allowed to collapse. The fascia of the 11mm trocar sites was closed with figure-of-eight 0 vicryl sutures. The skin was closed with subcuticular sutures of 4-0 monocryl and topical skin adhesive. The orogastric tube was removed.  The patient tolerated the procedure well and was taken to the postanesthesia care unit in stable condition.   Specimen: Gallbladder  Complications: None  EBL: 10mL

## 2018-02-20 NOTE — Anesthesia Postprocedure Evaluation (Signed)
Anesthesia Post Note  Patient: Kaitlin CarboLori L Ruffner  Procedure(s) Performed: LAPAROSCOPIC CHOLECYSTECTOMY (N/A )  Patient location during evaluation: Endoscopy Anesthesia Type: General Level of consciousness: awake and alert Pain management: pain level controlled Vital Signs Assessment: post-procedure vital signs reviewed and stable Respiratory status: spontaneous breathing, nonlabored ventilation and respiratory function stable Cardiovascular status: blood pressure returned to baseline and stable Postop Assessment: no apparent nausea or vomiting Anesthetic complications: no     Last Vitals:  Vitals:   02/20/18 1349 02/20/18 1435  BP: 133/87 (!) (P) 150/83  Pulse: 83 (P) 67  Resp: 18   Temp: 36.4 C   SpO2: 100% (P) 100%    Last Pain:  Vitals:   02/20/18 1435  TempSrc:   PainSc: (P) 4                  Juliett Eastburn Garry Heater Rmoni Keplinger

## 2018-02-20 NOTE — Anesthesia Procedure Notes (Signed)
Procedure Name: Intubation Date/Time: 02/20/2018 10:43 AM Performed by: Rudean Hitt, CRNA Pre-anesthesia Checklist: Patient identified, Emergency Drugs available, Suction available, Patient being monitored and Timeout performed Patient Re-evaluated:Patient Re-evaluated prior to induction Oxygen Delivery Method: Circle system utilized Preoxygenation: Pre-oxygenation with 100% oxygen Induction Type: IV induction Ventilation: Mask ventilation without difficulty and Oral airway inserted - appropriate to patient size Laryngoscope Size: Mac and 4 Grade View: Grade I Tube type: Oral Tube size: 7.5 mm Number of attempts: 1 Airway Equipment and Method: Stylet and Patient positioned with wedge pillow Placement Confirmation: ETT inserted through vocal cords under direct vision,  positive ETCO2 and breath sounds checked- equal and bilateral Secured at: 22 cm Tube secured with: Tape (Paper tape)

## 2018-02-20 NOTE — Transfer of Care (Signed)
Immediate Anesthesia Transfer of Care Note  Patient: Kaitlin Garner  Procedure(s) Performed: LAPAROSCOPIC CHOLECYSTECTOMY (N/A )  Patient Location: PACU  Anesthesia Type:General  Level of Consciousness: awake, drowsy and patient cooperative  Airway & Oxygen Therapy: Patient Spontanous Breathing and Patient connected to face mask oxygen  Post-op Assessment: Report given to RN and Post -op Vital signs reviewed and stable  Post vital signs: Reviewed and stable  Last Vitals:  Vitals Value Taken Time  BP 146/96 02/20/2018 12:25 PM  Temp 36.7 C 02/20/2018 12:25 PM  Pulse 92 02/20/2018 12:29 PM  Resp 29 02/20/2018 12:29 PM  SpO2 97 % 02/20/2018 12:29 PM  Vitals shown include unvalidated device data.  Last Pain:  Vitals:   02/20/18 1225  PainSc: (P) Asleep         Complications: No apparent anesthesia complications

## 2018-02-21 LAB — SURGICAL PATHOLOGY

## 2018-02-22 ENCOUNTER — Telehealth: Payer: Self-pay | Admitting: General Surgery

## 2018-02-22 NOTE — Telephone Encounter (Signed)
Patient called to report she felt a little "wheezy" this AM, s/p cholecystectomy 2 days ago. Sore. Eating OK. No fever. Had used albuterol inhaler in the past, but saw it had "expired " 12/ 18.  OK to use inhaler. Heating pad to abdomen for comfort,  Reported child and grandchild diagnosed with pneumonia 3 days prior to her surgery on 3/ 28..  If symptoms worsen, should see PCP/ walk in clinic for assessment.   F/U with Dr. Murlean Callerirton Garner as scheduled.

## 2018-03-21 ENCOUNTER — Other Ambulatory Visit: Payer: Self-pay | Admitting: General Surgery

## 2018-03-21 DIAGNOSIS — R52 Pain, unspecified: Principal | ICD-10-CM

## 2018-03-21 DIAGNOSIS — T148XXA Other injury of unspecified body region, initial encounter: Principal | ICD-10-CM

## 2018-03-25 ENCOUNTER — Ambulatory Visit
Admission: RE | Admit: 2018-03-25 | Payer: BC Managed Care – PPO | Source: Ambulatory Visit | Admitting: Internal Medicine

## 2018-03-25 ENCOUNTER — Encounter: Admission: RE | Payer: Self-pay | Source: Ambulatory Visit

## 2018-03-25 SURGERY — COLONOSCOPY WITH PROPOFOL
Anesthesia: General

## 2018-03-27 ENCOUNTER — Ambulatory Visit
Admission: RE | Admit: 2018-03-27 | Discharge: 2018-03-27 | Disposition: A | Discharge status: Home / Self Care | Payer: BC Managed Care – PPO | Source: Ambulatory Visit | Attending: General Surgery | Admitting: General Surgery

## 2018-03-27 DIAGNOSIS — R52 Pain, unspecified: Secondary | ICD-10-CM | POA: Diagnosis not present

## 2018-03-27 DIAGNOSIS — T148XXA Other injury of unspecified body region, initial encounter: Secondary | ICD-10-CM | POA: Diagnosis not present

## 2018-03-27 DIAGNOSIS — Z91041 Radiographic dye allergy status: Secondary | ICD-10-CM | POA: Diagnosis not present

## 2018-03-27 DIAGNOSIS — X58XXXA Exposure to other specified factors, initial encounter: Secondary | ICD-10-CM | POA: Insufficient documentation

## 2018-03-27 DIAGNOSIS — K429 Umbilical hernia without obstruction or gangrene: Secondary | ICD-10-CM | POA: Insufficient documentation

## 2018-04-22 ENCOUNTER — Encounter: Payer: Self-pay | Admitting: *Deleted

## 2018-04-23 ENCOUNTER — Ambulatory Visit: Payer: BC Managed Care – PPO | Admitting: Anesthesiology

## 2018-04-23 ENCOUNTER — Encounter
Admission: RE | Disposition: A | Discharge status: Home / Self Care | Payer: Self-pay | Source: Ambulatory Visit | Attending: Internal Medicine

## 2018-04-23 ENCOUNTER — Ambulatory Visit
Admission: RE | Admit: 2018-04-23 | Discharge: 2018-04-23 | Disposition: A | Discharge status: Home / Self Care | Payer: BC Managed Care – PPO | Source: Ambulatory Visit | Attending: Internal Medicine | Admitting: Internal Medicine

## 2018-04-23 ENCOUNTER — Encounter: Payer: Self-pay | Admitting: *Deleted

## 2018-04-23 DIAGNOSIS — Z888 Allergy status to other drugs, medicaments and biological substances status: Secondary | ICD-10-CM | POA: Insufficient documentation

## 2018-04-23 DIAGNOSIS — K219 Gastro-esophageal reflux disease without esophagitis: Secondary | ICD-10-CM | POA: Diagnosis not present

## 2018-04-23 DIAGNOSIS — Z6841 Body Mass Index (BMI) 40.0 and over, adult: Secondary | ICD-10-CM | POA: Insufficient documentation

## 2018-04-23 DIAGNOSIS — Z1211 Encounter for screening for malignant neoplasm of colon: Secondary | ICD-10-CM | POA: Diagnosis not present

## 2018-04-23 DIAGNOSIS — K5730 Diverticulosis of large intestine without perforation or abscess without bleeding: Secondary | ICD-10-CM | POA: Insufficient documentation

## 2018-04-23 DIAGNOSIS — E559 Vitamin D deficiency, unspecified: Secondary | ICD-10-CM | POA: Diagnosis not present

## 2018-04-23 DIAGNOSIS — Z91041 Radiographic dye allergy status: Secondary | ICD-10-CM | POA: Diagnosis not present

## 2018-04-23 DIAGNOSIS — Z79899 Other long term (current) drug therapy: Secondary | ICD-10-CM | POA: Insufficient documentation

## 2018-04-23 DIAGNOSIS — J45909 Unspecified asthma, uncomplicated: Secondary | ICD-10-CM | POA: Diagnosis not present

## 2018-04-23 DIAGNOSIS — Z91040 Latex allergy status: Secondary | ICD-10-CM | POA: Diagnosis not present

## 2018-04-23 DIAGNOSIS — Z882 Allergy status to sulfonamides status: Secondary | ICD-10-CM | POA: Diagnosis not present

## 2018-04-23 DIAGNOSIS — Z87891 Personal history of nicotine dependence: Secondary | ICD-10-CM | POA: Diagnosis not present

## 2018-04-23 DIAGNOSIS — E039 Hypothyroidism, unspecified: Secondary | ICD-10-CM | POA: Diagnosis not present

## 2018-04-23 DIAGNOSIS — F419 Anxiety disorder, unspecified: Secondary | ICD-10-CM | POA: Insufficient documentation

## 2018-04-23 DIAGNOSIS — Z91030 Bee allergy status: Secondary | ICD-10-CM | POA: Insufficient documentation

## 2018-04-23 DIAGNOSIS — K640 First degree hemorrhoids: Secondary | ICD-10-CM | POA: Diagnosis not present

## 2018-04-23 DIAGNOSIS — Z91013 Allergy to seafood: Secondary | ICD-10-CM | POA: Insufficient documentation

## 2018-04-23 DIAGNOSIS — Z79890 Hormone replacement therapy (postmenopausal): Secondary | ICD-10-CM | POA: Insufficient documentation

## 2018-04-23 DIAGNOSIS — K449 Diaphragmatic hernia without obstruction or gangrene: Secondary | ICD-10-CM | POA: Insufficient documentation

## 2018-04-23 HISTORY — DX: Other abnormalities of breathing: R06.89

## 2018-04-23 HISTORY — DX: Other specified health status: Z78.9

## 2018-04-23 HISTORY — DX: Gastro-esophageal reflux disease with esophagitis: K21.0

## 2018-04-23 HISTORY — DX: Vitamin D deficiency, unspecified: E55.9

## 2018-04-23 HISTORY — DX: Dermatitis, unspecified: L30.9

## 2018-04-23 HISTORY — DX: Cough, unspecified: R05.9

## 2018-04-23 HISTORY — DX: Dyspnea, unspecified: R06.00

## 2018-04-23 HISTORY — PX: COLONOSCOPY WITH PROPOFOL: SHX5780

## 2018-04-23 HISTORY — DX: Gastro-esophageal reflux disease with esophagitis, without bleeding: K21.00

## 2018-04-23 HISTORY — DX: Allergy status to unspecified drugs, medicaments and biological substances: Z88.9

## 2018-04-23 HISTORY — DX: Cough: R05

## 2018-04-23 SURGERY — COLONOSCOPY WITH PROPOFOL
Anesthesia: General

## 2018-04-23 MED ORDER — SODIUM CHLORIDE 0.9 % IV SOLN
INTRAVENOUS | Status: DC
  Administered 2018-04-23: 15:00:00 via INTRAVENOUS

## 2018-04-23 MED ORDER — PROPOFOL 10 MG/ML IV BOLUS
INTRAVENOUS | Status: DC | PRN
  Administered 2018-04-23: 70 mg via INTRAVENOUS

## 2018-04-23 MED ORDER — PROPOFOL 500 MG/50ML IV EMUL
INTRAVENOUS | Status: DC | PRN
  Administered 2018-04-23: 140 ug/kg/min via INTRAVENOUS

## 2018-04-23 NOTE — Interval H&P Note (Signed)
History and Physical Interval Note:  04/23/2018 3:03 PM  Kaitlin Garner  has presented today for surgery, with the diagnosis of SCREENING  The various methods of treatment have been discussed with the patient and family. After consideration of risks, benefits and other options for treatment, the patient has consented to  Procedure(s): COLONOSCOPY WITH PROPOFOL (N/A) as a surgical intervention .  The patient's history has been reviewed, patient examined, no change in status, stable for surgery.  I have reviewed the patient's chart and labs.  Questions were answered to the patient's satisfaction.     [place], Rennert

## 2018-04-23 NOTE — Op Note (Signed)
[facility] Patient Name: Kaitlin Garner Procedure Date: 04/23/2018 3:11 PM MRN: [id number] Account #: [id number] Date of Birth: 05/10/1966 Admit Type: Outpatient Age: 52 Room: [facility] ENDO ROOM 2 Gender: Female Note Status: Finalized Procedure:            Colonoscopy Indications:          Screening for colorectal malignant neoplasm Providers:            Boykin Nearing. [name] MD, MD Referring MD:         Melanee Spry. Ether Griffins (Referring MD) Medicines:            Propofol per Anesthesia Complications:        No immediate complications. Procedure:            Pre-Anesthesia Assessment:                       - The risks and benefits of the procedure and the                        sedation options and risks were discussed with the                        patient. All questions were answered and informed                        consent was obtained.                       - Patient identification and proposed procedure were                        verified prior to the procedure by the nurse. The                        procedure was verified in the procedure room.                       - ASA Grade Assessment: III - A patient with severe                        systemic disease.                       - After reviewing the risks and benefits, the patient                        was deemed in satisfactory condition to undergo the                        procedure.                       After obtaining informed consent, the colonoscope was                        passed under direct vision. Throughout the procedure,                        the patient's blood pressure, pulse, and oxygen  saturations were monitored continuously. The                        Colonoscope was introduced through the anus and                        advanced to the the cecum, identified by appendiceal                        orifice and ileocecal valve. The colonoscopy was            performed without difficulty. The patient tolerated the                        procedure well. The quality of the bowel preparation                        was adequate. The ileocecal valve, appendiceal orifice,                        and rectum were photographed. Findings:      The perianal and digital rectal examinations were normal. Pertinent       negatives include normal sphincter tone and no palpable rectal lesions.      A few small-mouthed diverticula were found in the sigmoid colon.      Non-bleeding internal hemorrhoids were found during retroflexion. The       hemorrhoids were Grade I (internal hemorrhoids that do not prolapse).      The exam was otherwise without abnormality. Impression:           - Diverticulosis in the sigmoid colon.                       - Non-bleeding internal hemorrhoids.                       - The examination was otherwise normal.                       - No specimens collected. Recommendation:       - Patient has a contact number available for                        emergencies. The signs and symptoms of potential                        delayed complications were discussed with the patient.                        Return to normal activities tomorrow. Written discharge                        instructions were provided to the patient.                       - Resume previous diet.                       - Continue present medications.                       - Repeat colonoscopy in 10 years for  screening purposes.                       - Return to GI office PRN.                       - The findings and recommendations were discussed with                        the patient and their family. Procedure Code(s):    --- Professional ---                       R6045, Colorectal cancer screening; colonoscopy on                        individual not meeting criteria for high risk Diagnosis Code(s):    --- Professional ---                       K57.30,  Diverticulosis of large intestine without                        perforation or abscess without bleeding                       K64.0, First degree hemorrhoids                       Z12.11, Encounter for screening for malignant neoplasm                        of colon CPT copyright 2017 American Medical Association. All rights reserved. The codes documented in this report are preliminary and upon coder review may  be revised to meet current compliance requirements. Stanton Kidney MD, MD 04/23/2018 3:35:30 PM This report has been signed electronically. Number of Addenda: 0 Note Initiated On: 04/23/2018 3:11 PM Scope Withdrawal Time: 0 hours 8 minutes 57 seconds  Total Procedure Duration: 0 hours 11 minutes 43 seconds       [facility]

## 2018-04-23 NOTE — Transfer of Care (Signed)
Immediate Anesthesia Transfer of Care Note  Patient: Kaitlin Garner  Procedure(s) Performed: COLONOSCOPY WITH PROPOFOL (N/A )  Patient Location: PACU  Anesthesia Type:General  Level of Consciousness: awake, alert  and oriented  Airway & Oxygen Therapy: Patient Spontanous Breathing and Patient connected to nasal cannula oxygen  Post-op Assessment: Report given to RN and Post -op Vital signs reviewed and stable  Post vital signs: Reviewed and stable  Last Vitals:  Vitals Value Taken Time  BP 108/76 04/23/2018  3:41 PM  Temp 36.1 C 04/23/2018  3:41 PM  Pulse 87 04/23/2018  3:41 PM  Resp 8 04/23/2018  3:41 PM  SpO2 98 % 04/23/2018  3:41 PM    Last Pain:  Vitals:   04/23/18 1538  TempSrc: Tympanic  PainSc: 0-No pain         Complications: No apparent anesthesia complications

## 2018-04-23 NOTE — Anesthesia Postprocedure Evaluation (Signed)
Anesthesia Post Note  Patient: Kaitlin Garner  Procedure(s) Performed: COLONOSCOPY WITH PROPOFOL (N/A )  Patient location during evaluation: Endoscopy Anesthesia Type: General Level of consciousness: awake and alert Pain management: pain level controlled Vital Signs Assessment: post-procedure vital signs reviewed and stable Respiratory status: spontaneous breathing and respiratory function stable Cardiovascular status: stable Anesthetic complications: no     Last Vitals:  Vitals:   04/23/18 1551 04/23/18 1601  BP: 134/72 129/82  Pulse: 89 85  Resp: 17 20  Temp:    SpO2: 98% 100%    Last Pain:  Vitals:   04/23/18 1601  TempSrc:   PainSc: 0-No pain                 KEPHART,WILLIAM K

## 2018-04-23 NOTE — Anesthesia Post-op Follow-up Note (Signed)
Anesthesia QCDR form completed.        

## 2018-04-23 NOTE — H&P (Signed)
Outpatient short stay form Pre-procedure 04/23/2018 3:01 PM Kaitlin Garner Kaitlin Garner, M.D.  Primary Physician: Wendie Simmer  Reason for visit:  Colon cancer screening.  History of present illness:  Patient presents for colon cancer screening. The patient denies abdominal pain, abnormal weight loss or rectal bleeding.     Current Facility-Administered Medications:  .  0.9 %  sodium chloride infusion, , Intravenous, Continuous, [place], Boykin Nearing, MD, Last Rate: 20 mL/hr at 04/23/18 1435  Medications Prior to Admission  Medication Sig Dispense Refill Last Dose  . acetaminophen-codeine (TYLENOL #3) 300-30 MG tablet Take 1 tablet by mouth every 4 (four) hours as needed for moderate pain.   Past Month at Unknown time  . albuterol (PROVENTIL HFA;VENTOLIN HFA) 108 (90 Base) MCG/ACT inhaler Inhale 2 puffs into the lungs every 4 (four) hours as needed for wheezing or shortness of breath. 1 Inhaler 1 Past Month at Unknown time  . ALPRAZolam (XANAX) 0.5 MG tablet Take 0.5 mg by mouth 2 (two) times daily as needed for anxiety.   Past Week at Unknown time  . ergocalciferol (VITAMIN D2) 50000 units capsule Take 50,000 Units by mouth once a week. Sunday   Past Week at Unknown time  . HYDROcodone-acetaminophen (NORCO/VICODIN) 5-325 MG tablet Take 1 tablet by mouth every 6 (six) hours as needed for moderate pain.   Past Week at Unknown time  . levocetirizine (XYZAL) 5 MG tablet Take 5 mg by mouth every evening.   04/23/2018 at 0600  . levothyroxine (SYNTHROID, LEVOTHROID) 125 MCG tablet Take 125 mcg by mouth daily before breakfast.    04/23/2018 at 0600  . ondansetron (ZOFRAN) 4 MG tablet Take 4 mg by mouth every 8 (eight) hours as needed for nausea or vomiting.   Past Month at Unknown time  . pantoprazole (PROTONIX) 40 MG tablet Take 40 mg by mouth 2 (two) times daily before a meal.    04/23/2018 at 0600  . Probiotic Product (PROBIOTIC PO) Take 1 tablet by mouth daily.   04/23/2018 at 0600  . promethazine (PHENERGAN)  25 MG tablet Take 25 mg by mouth every 6 (six) hours as needed for nausea or vomiting.   Past Month at Unknown time  . valACYclovir (VALTREX) 1000 MG tablet Take 2,000 mg by mouth 2 (two) times daily as needed (flare ups).   Past Month at Unknown time  . celecoxib (CELEBREX) 200 MG capsule Take 200 mg by mouth daily.   Not Taking at Unknown time  . diazepam (VALIUM) 10 MG tablet Take 10 mg by mouth.   Not Taking at Unknown time  . oxyCODONE-acetaminophen (PERCOCET/ROXICET) 5-325 MG tablet Take by mouth every 4 (four) hours as needed for severe pain.   Not Taking at Unknown time  . PARoxetine (PAXIL-CR) 12.5 MG 24 hr tablet Take 12.5 mg by mouth daily.    Not Taking at Unknown time     Allergies  Allergen Reactions  . Fish Allergy Anaphylaxis and Swelling  . Ivp Dye [Iodinated Diagnostic Agents] Shortness Of Breath and Other (See Comments)    Itchy, burning sensation   . Latex Swelling and Other (See Comments)    Blisters  . Pollen Extracts [Pollen Extract]   . Sulfa Antibiotics Swelling  . Nystatin Rash     Past Medical History:  Diagnosis Date  . Allergic genetic state   . Anxiety   . Arthritis    LOWER BACK AND RIGHT KNEE  . Asthma    PT DENIES THIS DURING 02-17-18 INTERVIEW AND  STATES SHE HAS INHALER FOR SOME BREATHING ISSUES IN 2018 THAT HAVE SINCE RESOLVED  . Cough   . Dyspnea and respiratory abnormality   . Eczema   . Foot pain, left 02/17/2018   PT HAS BONE SPURS AND A TORN ACHILLES TENDON-PT IN A BOOT  . GERD (gastroesophageal reflux disease)   . Heartburn   . History of hiatal hernia   . Hypothyroidism   . Morbid obesity (HCC)   . Poor intravenous access   . Reflux esophagitis   . Vitamin D deficiency     Review of systems:   Otherwise negative.    Physical Exam  Gen: Alert, oriented. Appears stated age.  HEENT: [place]/AT. PERRLA. Lungs: CTA, no wheezes. CV: RR nl S1, S2. Abd: soft, benign, no masses. BS+ Ext: No edema. Pulses 2+    Planned procedures:  Proceed with colonscopy. The patient understands the nature of the planned procedure, indications, risks, alternatives and potential complications including but not limited to bleeding, infection, perforation, damage to internal organs and possible oversedation/side effects from anesthesia. The patient agrees and gives consent to proceed.  Please refer to procedure notes for findings, recommendations and patient disposition/instructions.    Kaitlin Garner Kaitlin Garner, M.D. Gastroenterology 04/23/2018  3:01 PM

## 2018-04-23 NOTE — Anesthesia Preprocedure Evaluation (Addendum)
Anesthesia Evaluation  Patient identified by MRN, date of birth, ID band Patient awake    Reviewed: Allergy & Precautions, H&P , NPO status , Patient's Chart, lab work & pertinent test results, reviewed documented beta blocker date and time   Airway Mallampati: II   Neck ROM: full    Dental  (+) Teeth Intact   Pulmonary neg pulmonary ROS, asthma , former smoker,    Pulmonary exam normal        Cardiovascular Exercise Tolerance: Good negative cardio ROS Normal cardiovascular exam Rhythm:regular Rate:Normal     Neuro/Psych Anxiety negative neurological ROS  negative psych ROS   GI/Hepatic negative GI ROS, Neg liver ROS, hiatal hernia, GERD  ,  Endo/Other  negative endocrine ROSHypothyroidism Morbid obesity  Renal/GU negative Renal ROS  negative genitourinary   Musculoskeletal   Abdominal   Peds  Hematology negative hematology ROS (+)   Anesthesia Other Findings Past Medical History: No date: Allergic genetic state No date: Anxiety No date: Arthritis     Comment:  LOWER BACK AND RIGHT KNEE No date: Asthma     Comment:  PT DENIES THIS DURING 02-17-18 INTERVIEW AND STATES SHE               HAS INHALER FOR SOME BREATHING ISSUES IN 2018 THAT HAVE               SINCE RESOLVED No date: Cough No date: Dyspnea and respiratory abnormality No date: Eczema 02/17/2018: Foot pain, left     Comment:  PT HAS BONE SPURS AND A TORN ACHILLES TENDON-PT IN A               BOOT No date: GERD (gastroesophageal reflux disease) No date: Heartburn No date: History of hiatal hernia No date: Hypothyroidism No date: Morbid obesity (HCC) No date: Poor intravenous access No date: Reflux esophagitis No date: Vitamin D deficiency Past Surgical History: No date: ABLATION No date: BACK SURGERY     Comment:  LOWER 05/10/2016: CARDIAC CATHETERIZATION; Bilateral     Comment:  Procedure: Right/Left Heart Cath and Coronary   Angiography;  Surgeon: Alwyn Pea, MD;  Location:               [facility] INVASIVE CV LAB;  Service: Cardiovascular;                Laterality: Bilateral; No date: CESAREAN SECTION     Comment:  X2 02/20/2018: CHOLECYSTECTOMY; N/A     Comment:  Procedure: LAPAROSCOPIC CHOLECYSTECTOMY;  Surgeon:               Carolan Shiver, MD;  Location: [facility] ORS;  Service:              General;  Laterality: N/A; 07/23/2016: ESOPHAGOGASTRODUODENOSCOPY (EGD) WITH PROPOFOL; N/A     Comment:  Procedure: ESOPHAGOGASTRODUODENOSCOPY (EGD) WITH               PROPOFOL;  Surgeon: Christena Deem, MD;  Location:               [facility] ENDOSCOPY;  Service: Endoscopy;  Laterality: N/A; No date: Right Arthroscopy  No date: TONSILLECTOMY No date: TUBAL LIGATION   Reproductive/Obstetrics negative OB ROS                            Anesthesia Physical Anesthesia Plan  ASA: III  Anesthesia Plan: General   Post-op Pain Management:    Induction:  PONV Risk Score and Plan:   Airway Management Planned:   Additional Equipment:   Intra-op Plan:   Post-operative Plan:   Informed Consent: I have reviewed the patients History and Physical, chart, labs and discussed the procedure including the risks, benefits and alternatives for the proposed anesthesia with the patient or authorized representative who has indicated his/her understanding and acceptance.   Dental Advisory Given  Plan Discussed with: CRNA  Anesthesia Plan Comments:         Anesthesia Quick Evaluation

## 2018-04-24 ENCOUNTER — Encounter: Payer: Self-pay | Admitting: Internal Medicine

## 2018-06-17 IMAGING — US US ABDOMEN LIMITED
1 series · 14 of 25 positions shown · non-contrast
Comparison: CT abdomen and pelvis May 02, 2013

CLINICAL DATA: Upper abdominal pain

EXAM:
ULTRASOUND ABDOMEN LIMITED RIGHT UPPER QUADRANT

[Series 1: us abdomen limited · 0.26mm/px · 14 of 35 slices shown]
[im 1/35]
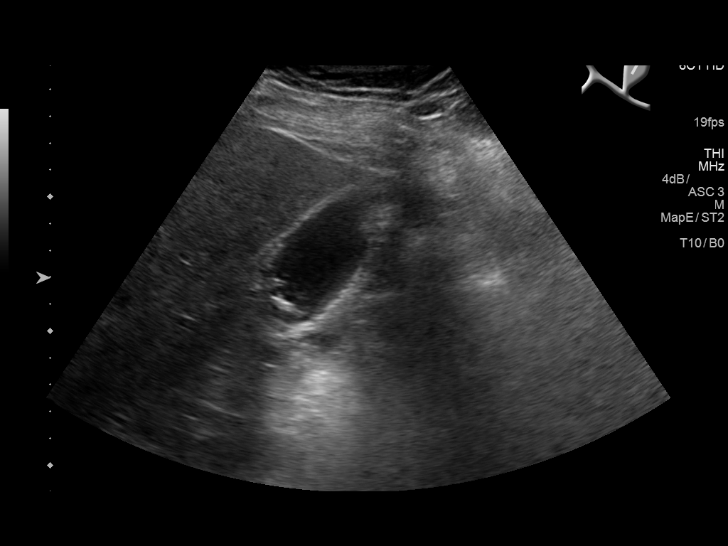
[im 3/35]
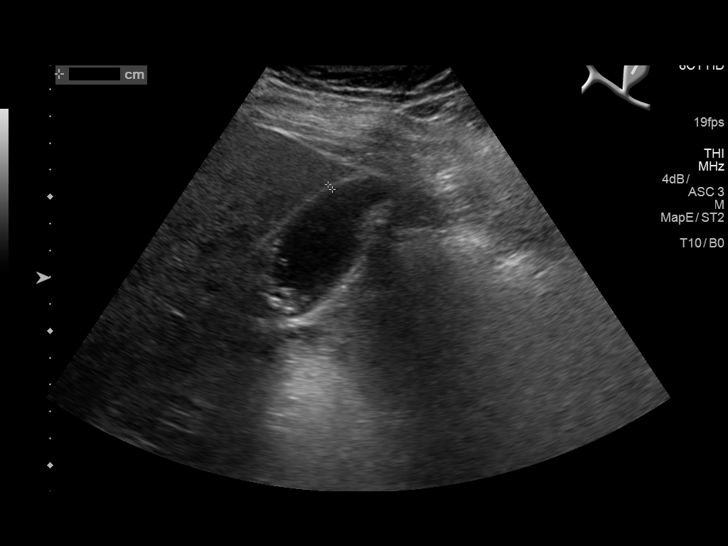
[im 6/35]
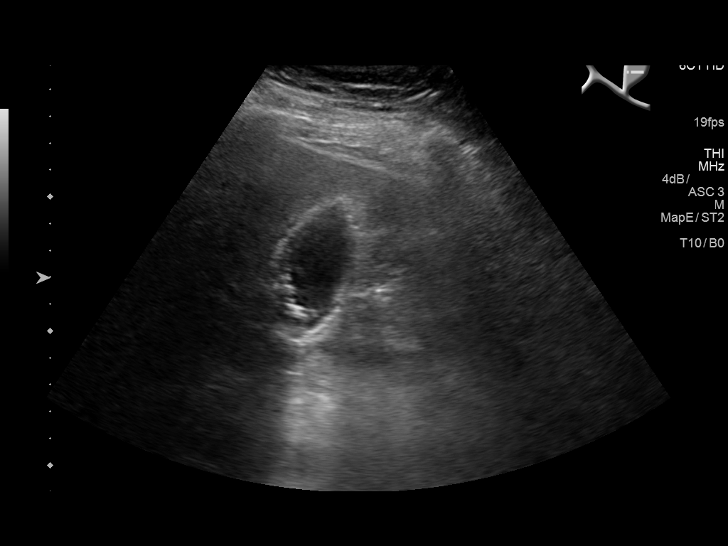
[im 9/35]
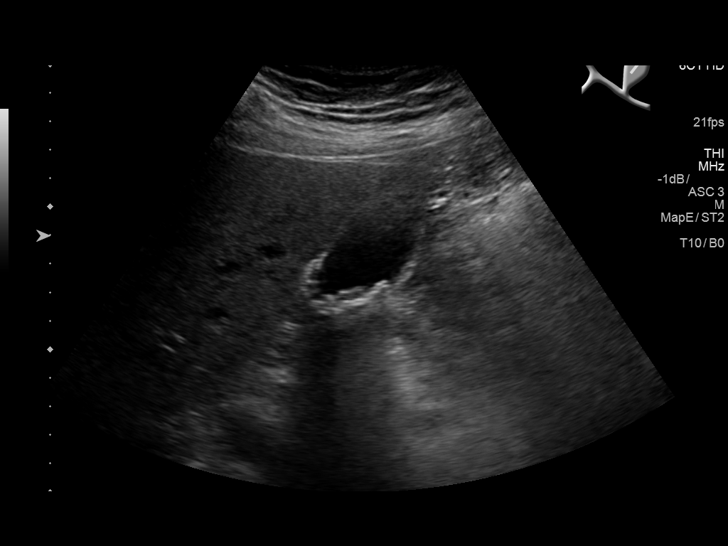
[im 12/35]
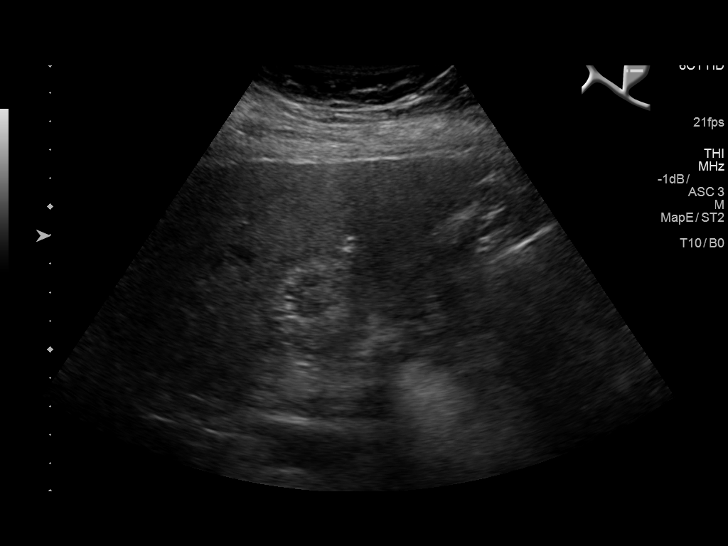
[im 13/35]
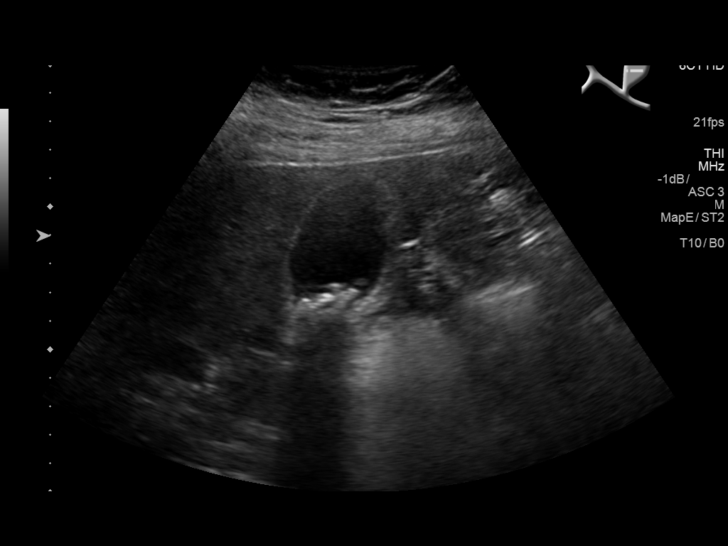
[im 16/35]
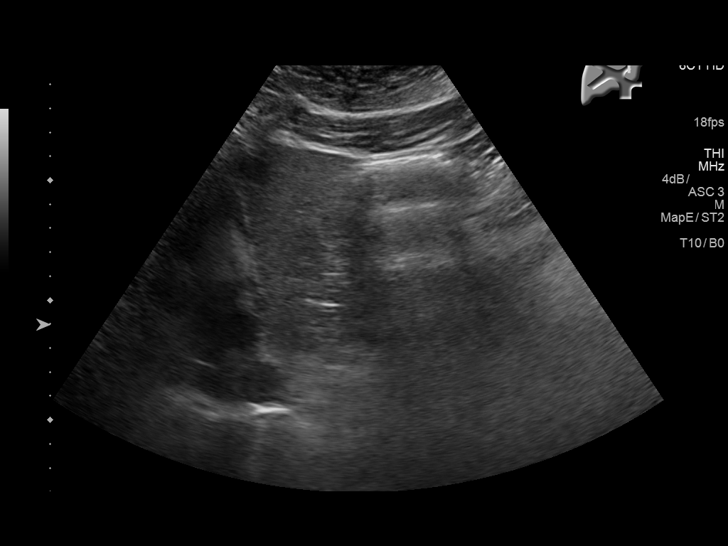
[im 19/35]
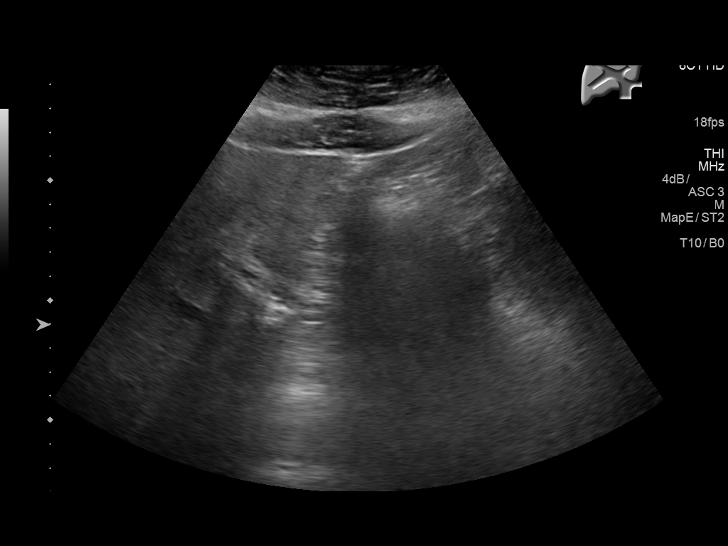
[im 22/35]
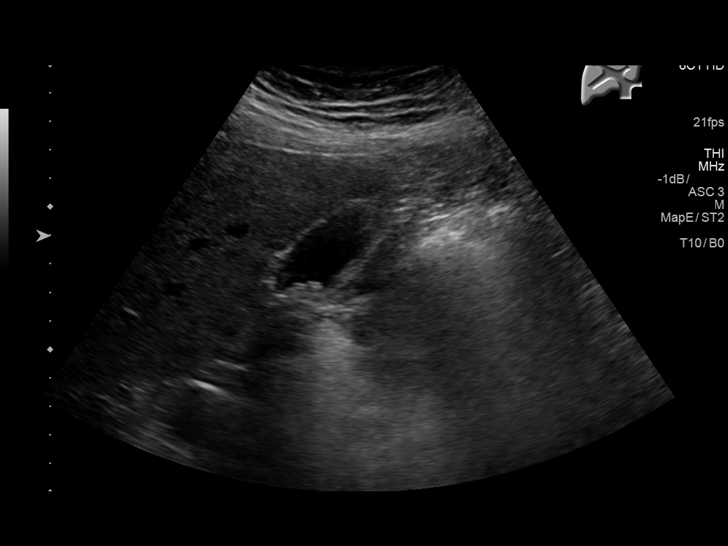
[im 23/35]
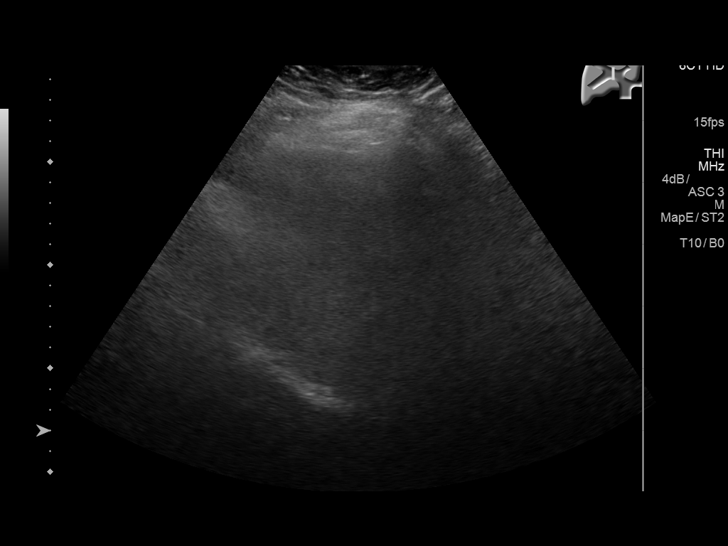
[im 26/35]
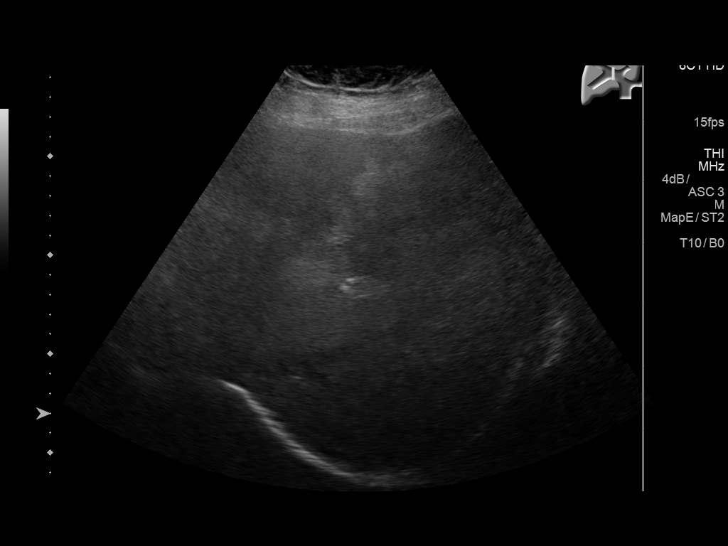
[im 29/35]
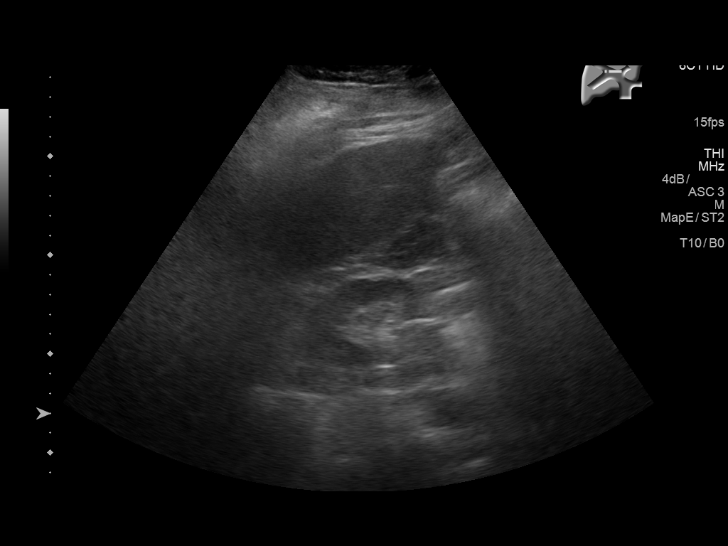
[im 32/35]
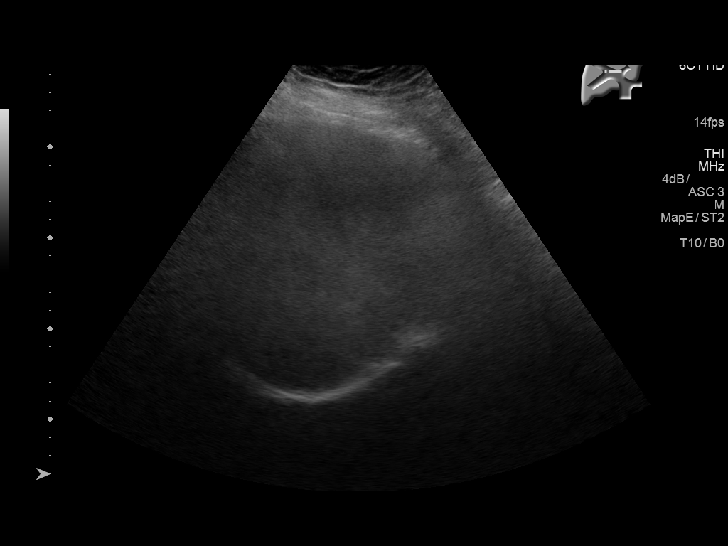
[im 35/35]
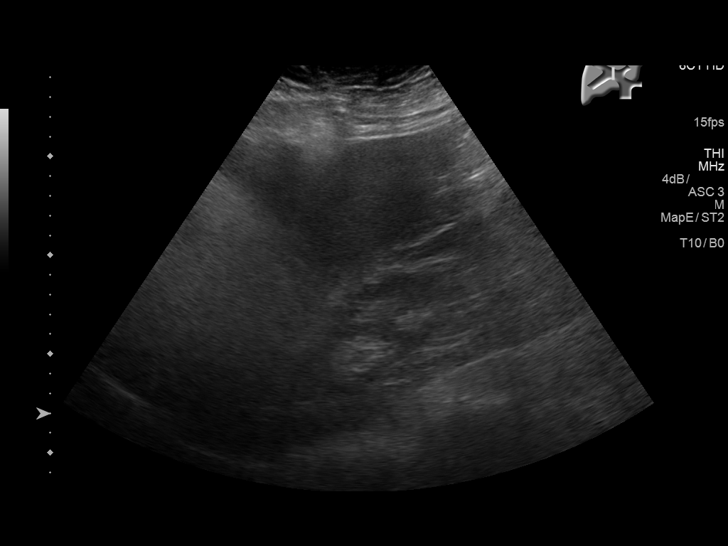

[14 of 25 positions shown; findings below may reference images not displayed]

FINDINGS: Gallbladder:

Within the gallbladder, there are multiple echogenic foci which move
and shadow consistent with cholelithiasis. Largest individual
gallstone measures 1.0 cm in length. There is no appreciable
gallbladder wall thickening or pericholecystic fluid. No sonographic
Murphy sign noted by sonographer.

Common bile duct:

Diameter: 3 mm. No intrahepatic or extrahepatic biliary duct
dilatation.

Liver:

No focal lesion identified. Liver echogenicity is increased. Portal
vein is patent on color Doppler imaging with normal direction of
blood flow towards the liver.
IMPRESSION: 1. Cholelithiasis. No gallbladder wall thickening or pericholecystic
fluid.

2. Increase in liver echogenicity, a finding indicative of hepatic
steatosis. While no focal liver lesions are evident on this study,
it must be cautioned that the sensitivity of ultrasound for
detection of focal liver lesions is diminished in this circumstance.

## 2018-07-15 ENCOUNTER — Other Ambulatory Visit: Payer: Self-pay | Admitting: Podiatry

## 2018-07-15 DIAGNOSIS — M7662 Achilles tendinitis, left leg: Secondary | ICD-10-CM

## 2018-07-30 ENCOUNTER — Ambulatory Visit
Admission: RE | Admit: 2018-07-30 | Discharge: 2018-07-30 | Disposition: A | Discharge status: Home / Self Care | Payer: BC Managed Care – PPO | Source: Ambulatory Visit | Attending: Podiatry | Admitting: Podiatry

## 2018-07-30 DIAGNOSIS — M7662 Achilles tendinitis, left leg: Secondary | ICD-10-CM | POA: Insufficient documentation

## 2018-07-30 DIAGNOSIS — M722 Plantar fascial fibromatosis: Secondary | ICD-10-CM | POA: Insufficient documentation

## 2018-09-10 ENCOUNTER — Other Ambulatory Visit (HOSPITAL_COMMUNITY): Payer: Self-pay | Admitting: Family Medicine

## 2018-09-10 DIAGNOSIS — M542 Cervicalgia: Secondary | ICD-10-CM

## 2018-09-10 DIAGNOSIS — M5412 Radiculopathy, cervical region: Secondary | ICD-10-CM

## 2018-09-30 ENCOUNTER — Encounter
Admission: RE | Admit: 2018-09-30 | Discharge: 2018-09-30 | Disposition: A | Discharge status: Home / Self Care | Payer: BC Managed Care – PPO | Source: Ambulatory Visit | Attending: Podiatry | Admitting: Podiatry

## 2018-09-30 ENCOUNTER — Other Ambulatory Visit: Payer: Self-pay | Admitting: Podiatry

## 2018-09-30 ENCOUNTER — Other Ambulatory Visit: Payer: BC Managed Care – PPO

## 2018-09-30 ENCOUNTER — Other Ambulatory Visit: Payer: Self-pay

## 2018-09-30 DIAGNOSIS — M7662 Achilles tendinitis, left leg: Secondary | ICD-10-CM | POA: Diagnosis not present

## 2018-09-30 DIAGNOSIS — Z01818 Encounter for other preprocedural examination: Secondary | ICD-10-CM | POA: Insufficient documentation

## 2018-09-30 HISTORY — DX: Personal history of other mental and behavioral disorders: Z86.59

## 2018-09-30 HISTORY — DX: Cardiac arrhythmia, unspecified: I49.9

## 2018-09-30 NOTE — Patient Instructions (Signed)
Your procedure is scheduled [id number] 11/15 Report to Day Surgery. To find out your arrival time please call [phone] between 1PM - 3PM on Thurs. 11/14.  Remember: Instructions that are not followed completely may result in serious medical risk,  up to and including death, or upon the discretion of your surgeon and anesthesiologist your  surgery may need to be rescheduled.     _X__ 1. Do not eat food after midnight the night before your procedure.                 No gum chewing or hard candies. You may drink clear liquids up to 2 hours                 before you are scheduled to arrive for your surgery- DO not drink clear                 liquids within 2 hours of the start of your surgery.                 Clear Liquids include:  water, apple juice without pulp, clear carbohydrate                 drink such as Clearfast of Gatorade, Black Coffee or Tea (Do not add                 anything to coffee or tea).  __X__2.  On the morning of surgery brush your teeth with toothpaste and water, you                may rinse your mouth with mouthwash if you wish.  Do not swallow any toothpaste of mouthwash.     _X__ 3.  No Alcohol for 24 hours before or after surgery.   ___ 4.  Do Not Smoke or use e-cigarettes For 24 Hours Prior to Your Surgery.                 Do not use any chewable tobacco products for at least 6 hours prior to                 surgery.  ____  5.  Bring all medications with you on the day of surgery if instructed.   _x___  6.  Notify your doctor if there is any change in your medical condition      (cold, fever, infections).     Do not wear jewelry, make-up, hairpins, clips or nail polish. Do not wear lotions, powders, or perfumes. You may wear deodorant. Do not shave 48 hours prior to surgery. Men may shave face and neck. Do not bring valuables to the hospital.    [facility] is not responsible for any belongings or valuables.  Contacts,  dentures or bridgework may not be worn into surgery. Leave your suitcase in the car. After surgery it may be brought to your room. For patients admitted to the hospital, discharge time is determined by your treatment team.   Patients discharged the day of surgery will not be allowed to drive home.   Please read over the following fact sheets that you were given:    __x__ Take these medicines the morning of surgery with A SIP OF WATER:    1. ALPRAZolam (XANAX) 0.5 MG tablet if needed  2. cyclobenzaprine (FLEXERIL) 10 MG tablet if needed  3. pantoprazole (PROTONIX) 40 MG tablet  4.  5.  6.  ____ Fleet Enema (as directed)  __x__ Use CHG Soap as directed  __x__ Use inhalers on the day of surgery albuterol (PROVENTIL HFA;VENTOLIN HFA) 108 (90 Base) MCG/ACT inhaler and bring it with you            budesonide-formoterol (SYMBICORT) 160-4.5 MCG/ACT inhaler ____ Stop metformin 2 days prior to surgery    ____ Take 1/2 of usual insulin dose the night before surgery. No insulin the morning          of surgery.   ____ Stop Coumadin/Plavix/aspirin on   ____ Stop Anti-inflammatories on    ____ Stop supplements until after surgery.    ____ Bring C-Pap to the hospital.

## 2018-10-09 ENCOUNTER — Ambulatory Visit: Admit: 2018-10-09 | Payer: BC Managed Care – PPO

## 2018-10-09 ENCOUNTER — Ambulatory Visit (HOSPITAL_COMMUNITY): Payer: BC Managed Care – PPO

## 2018-10-09 SURGERY — MRI WITH ANESTHESIA
Anesthesia: General

## 2018-10-09 MED ORDER — CEFAZOLIN SODIUM-DEXTROSE 2-4 GM/100ML-% IV SOLN
2.0000 g | INTRAVENOUS | Status: AC
  Administered 2018-10-10: 2 g via INTRAVENOUS

## 2018-10-10 ENCOUNTER — Encounter: Payer: Self-pay | Admitting: Emergency Medicine

## 2018-10-10 ENCOUNTER — Ambulatory Visit: Payer: BC Managed Care – PPO | Admitting: Certified Registered Nurse Anesthetist

## 2018-10-10 ENCOUNTER — Encounter
Admission: RE | Disposition: A | Discharge status: Home / Self Care | Payer: Self-pay | Source: Ambulatory Visit | Attending: Podiatry

## 2018-10-10 ENCOUNTER — Other Ambulatory Visit: Payer: Self-pay

## 2018-10-10 ENCOUNTER — Ambulatory Visit
Admission: RE | Admit: 2018-10-10 | Discharge: 2018-10-10 | Disposition: A | Discharge status: Home / Self Care | Payer: BC Managed Care – PPO | Source: Ambulatory Visit | Attending: Podiatry | Admitting: Podiatry

## 2018-10-10 DIAGNOSIS — Z87891 Personal history of nicotine dependence: Secondary | ICD-10-CM | POA: Diagnosis not present

## 2018-10-10 DIAGNOSIS — J45909 Unspecified asthma, uncomplicated: Secondary | ICD-10-CM | POA: Insufficient documentation

## 2018-10-10 DIAGNOSIS — K219 Gastro-esophageal reflux disease without esophagitis: Secondary | ICD-10-CM | POA: Diagnosis not present

## 2018-10-10 DIAGNOSIS — M7732 Calcaneal spur, left foot: Secondary | ICD-10-CM | POA: Diagnosis not present

## 2018-10-10 DIAGNOSIS — Z79890 Hormone replacement therapy (postmenopausal): Secondary | ICD-10-CM | POA: Diagnosis not present

## 2018-10-10 DIAGNOSIS — E039 Hypothyroidism, unspecified: Secondary | ICD-10-CM | POA: Diagnosis not present

## 2018-10-10 DIAGNOSIS — F419 Anxiety disorder, unspecified: Secondary | ICD-10-CM | POA: Diagnosis not present

## 2018-10-10 DIAGNOSIS — M7662 Achilles tendinitis, left leg: Secondary | ICD-10-CM | POA: Insufficient documentation

## 2018-10-10 DIAGNOSIS — Z79899 Other long term (current) drug therapy: Secondary | ICD-10-CM | POA: Diagnosis not present

## 2018-10-10 DIAGNOSIS — M79672 Pain in left foot: Secondary | ICD-10-CM | POA: Diagnosis present

## 2018-10-10 DIAGNOSIS — Z7951 Long term (current) use of inhaled steroids: Secondary | ICD-10-CM | POA: Diagnosis not present

## 2018-10-10 HISTORY — PX: ACHILLES TENDON SURGERY: SHX542

## 2018-10-10 HISTORY — PX: HEEL SPUR RESECTION: SHX6410

## 2018-10-10 LAB — POCT PREGNANCY, URINE: Preg Test, Ur: NEGATIVE

## 2018-10-10 SURGERY — REPAIR, TENDON, ACHILLES
Anesthesia: General | Site: Foot | Laterality: Left

## 2018-10-10 MED ORDER — LIDOCAINE HCL (PF) 1 % IJ SOLN
INTRAMUSCULAR | Status: AC
  Filled 2018-10-10: qty 30

## 2018-10-10 MED ORDER — LIDOCAINE HCL 4 % MT SOLN
OROMUCOSAL | Status: DC | PRN
  Administered 2018-10-10: 4 mL via TOPICAL

## 2018-10-10 MED ORDER — POVIDONE-IODINE 7.5 % EX SOLN
Freq: Once | CUTANEOUS | Status: DC
  Filled 2018-10-10: qty 118

## 2018-10-10 MED ORDER — SUGAMMADEX SODIUM 500 MG/5ML IV SOLN
INTRAVENOUS | Status: DC | PRN
  Administered 2018-10-10: 500 mg via INTRAVENOUS

## 2018-10-10 MED ORDER — MIDAZOLAM HCL 2 MG/2ML IJ SOLN
INTRAMUSCULAR | Status: DC | PRN
  Administered 2018-10-10: 5 mg via INTRAVENOUS

## 2018-10-10 MED ORDER — SUGAMMADEX SODIUM 500 MG/5ML IV SOLN
INTRAVENOUS | Status: AC
  Filled 2018-10-10: qty 5

## 2018-10-10 MED ORDER — FENTANYL CITRATE (PF) 100 MCG/2ML IJ SOLN
INTRAMUSCULAR | Status: AC
  Administered 2018-10-10: 25 ug via INTRAVENOUS
  Filled 2018-10-10: qty 2

## 2018-10-10 MED ORDER — CEFAZOLIN SODIUM-DEXTROSE 2-4 GM/100ML-% IV SOLN
INTRAVENOUS | Status: AC
  Filled 2018-10-10: qty 100

## 2018-10-10 MED ORDER — SUCCINYLCHOLINE CHLORIDE 20 MG/ML IJ SOLN
INTRAMUSCULAR | Status: DC | PRN
  Administered 2018-10-10: 120 mg via INTRAVENOUS

## 2018-10-10 MED ORDER — ONDANSETRON HCL 4 MG/2ML IJ SOLN
INTRAMUSCULAR | Status: AC
  Filled 2018-10-10: qty 2

## 2018-10-10 MED ORDER — FENTANYL CITRATE (PF) 100 MCG/2ML IJ SOLN
INTRAMUSCULAR | Status: DC | PRN
  Administered 2018-10-10: 50 ug via INTRAVENOUS

## 2018-10-10 MED ORDER — BUPIVACAINE HCL (PF) 0.5 % IJ SOLN
INTRAMUSCULAR | Status: AC
  Filled 2018-10-10: qty 30

## 2018-10-10 MED ORDER — CEFAZOLIN SODIUM-DEXTROSE 2-4 GM/100ML-% IV SOLN
INTRAVENOUS | Status: AC
  Administered 2018-10-10: 2 g via INTRAVENOUS
  Filled 2018-10-10: qty 100

## 2018-10-10 MED ORDER — PROPOFOL 10 MG/ML IV BOLUS
INTRAVENOUS | Status: DC | PRN
  Administered 2018-10-10: 200 mg via INTRAVENOUS

## 2018-10-10 MED ORDER — ONDANSETRON HCL 4 MG/2ML IJ SOLN: 4.0000 mg | Freq: Four times a day (QID) | INTRAMUSCULAR | Status: DC | PRN

## 2018-10-10 MED ORDER — ONDANSETRON HCL 4 MG PO TABS: 4.0000 mg | ORAL_TABLET | Freq: Four times a day (QID) | ORAL | Status: DC | PRN

## 2018-10-10 MED ORDER — ONDANSETRON HCL 4 MG/2ML IJ SOLN
4.0000 mg | Freq: Once | INTRAMUSCULAR | Status: AC | PRN
  Administered 2018-10-10: 4 mg via INTRAVENOUS

## 2018-10-10 MED ORDER — DEXAMETHASONE SODIUM PHOSPHATE 10 MG/ML IJ SOLN
INTRAMUSCULAR | Status: DC | PRN
  Administered 2018-10-10: 10 mg via INTRAVENOUS

## 2018-10-10 MED ORDER — MIDAZOLAM HCL 2 MG/2ML IJ SOLN
INTRAMUSCULAR | Status: AC
  Filled 2018-10-10: qty 2

## 2018-10-10 MED ORDER — BUPIVACAINE LIPOSOME 1.3 % IJ SUSP
INTRAMUSCULAR | Status: DC | PRN
  Administered 2018-10-10: 10 mL

## 2018-10-10 MED ORDER — ROCURONIUM BROMIDE 100 MG/10ML IV SOLN
INTRAVENOUS | Status: DC | PRN
  Administered 2018-10-10: 20 mg via INTRAVENOUS
  Administered 2018-10-10: 5 mg via INTRAVENOUS
  Administered 2018-10-10: 45 mg via INTRAVENOUS

## 2018-10-10 MED ORDER — BUPIVACAINE LIPOSOME 1.3 % IJ SUSP
INTRAMUSCULAR | Status: AC
  Filled 2018-10-10: qty 20

## 2018-10-10 MED ORDER — ROCURONIUM BROMIDE 50 MG/5ML IV SOLN
INTRAVENOUS | Status: AC
  Filled 2018-10-10: qty 1

## 2018-10-10 MED ORDER — PROPOFOL 10 MG/ML IV BOLUS
INTRAVENOUS | Status: AC
  Filled 2018-10-10: qty 20

## 2018-10-10 MED ORDER — DIPHENHYDRAMINE HCL 50 MG/ML IJ SOLN
25.0000 mg | Freq: Once | INTRAMUSCULAR | Status: AC
  Administered 2018-10-10: 25 mg via INTRAVENOUS

## 2018-10-10 MED ORDER — LIDOCAINE HCL (CARDIAC) PF 100 MG/5ML IV SOSY
PREFILLED_SYRINGE | INTRAVENOUS | Status: DC | PRN
  Administered 2018-10-10: 100 mg via INTRAVENOUS

## 2018-10-10 MED ORDER — ONDANSETRON HCL 4 MG/2ML IJ SOLN
INTRAMUSCULAR | Status: DC | PRN
  Administered 2018-10-10: 4 mg via INTRAVENOUS

## 2018-10-10 MED ORDER — HYDROCODONE-ACETAMINOPHEN 5-325 MG PO TABS
ORAL_TABLET | ORAL | Status: AC
  Filled 2018-10-10: qty 2

## 2018-10-10 MED ORDER — DEXAMETHASONE SODIUM PHOSPHATE 10 MG/ML IJ SOLN
INTRAMUSCULAR | Status: AC
  Filled 2018-10-10: qty 1

## 2018-10-10 MED ORDER — BUPIVACAINE HCL (PF) 0.25 % IJ SOLN
INTRAMUSCULAR | Status: AC
  Filled 2018-10-10: qty 30

## 2018-10-10 MED ORDER — BUPIVACAINE-EPINEPHRINE 0.25% -1:200000 IJ SOLN
INTRAMUSCULAR | Status: DC | PRN
  Administered 2018-10-10: 10 mL

## 2018-10-10 MED ORDER — FENTANYL CITRATE (PF) 250 MCG/5ML IJ SOLN
INTRAMUSCULAR | Status: AC
  Filled 2018-10-10: qty 5

## 2018-10-10 MED ORDER — FENTANYL CITRATE (PF) 100 MCG/2ML IJ SOLN
25.0000 ug | INTRAMUSCULAR | Status: AC | PRN
  Administered 2018-10-10 (×6): 25 ug via INTRAVENOUS

## 2018-10-10 MED ORDER — HYDROCODONE-ACETAMINOPHEN 5-325 MG PO TABS: 1.0000 | ORAL_TABLET | Freq: Four times a day (QID) | ORAL | 0 refills | Status: AC | PRN

## 2018-10-10 MED ORDER — EPINEPHRINE PF 1 MG/ML IJ SOLN
INTRAMUSCULAR | Status: AC
  Filled 2018-10-10: qty 2

## 2018-10-10 MED ORDER — HYDROCODONE-ACETAMINOPHEN 5-325 MG PO TABS
2.0000 | ORAL_TABLET | Freq: Once | ORAL | Status: AC
  Administered 2018-10-10: 2 via ORAL

## 2018-10-10 MED ORDER — LACTATED RINGERS IV SOLN
INTRAVENOUS | Status: DC
  Administered 2018-10-10: 07:00:00 via INTRAVENOUS

## 2018-10-10 MED ORDER — DIPHENHYDRAMINE HCL 50 MG/ML IJ SOLN
INTRAMUSCULAR | Status: AC
  Filled 2018-10-10: qty 1

## 2018-10-10 MED ORDER — MIDAZOLAM HCL 5 MG/5ML IJ SOLN
INTRAMUSCULAR | Status: AC
  Filled 2018-10-10: qty 5

## 2018-10-10 MED ORDER — SUCCINYLCHOLINE CHLORIDE 20 MG/ML IJ SOLN
INTRAMUSCULAR | Status: AC
  Filled 2018-10-10: qty 1

## 2018-10-10 MED ORDER — LIDOCAINE HCL (PF) 2 % IJ SOLN
INTRAMUSCULAR | Status: AC
  Filled 2018-10-10: qty 10

## 2018-10-10 SURGICAL SUPPLY — 61 items
ANCHOR 4.5 FOOTPRINT ULTRA (Anchor) ×1 IMPLANT
BANDAGE ELASTIC 4 LF NS (GAUZE/BANDAGES/DRESSINGS) ×4 IMPLANT
BLADE SURG 15 STRL LF DISP TIS (BLADE) ×2 IMPLANT
BLADE SURG 15 STRL SS (BLADE) ×2
BLADE SURG MINI STRL (BLADE) ×2 IMPLANT
BNDG CONFORM 2 STRL LF (GAUZE/BANDAGES/DRESSINGS) ×2 IMPLANT
BNDG CONFORM 3 STRL LF (GAUZE/BANDAGES/DRESSINGS) ×1 IMPLANT
BNDG ESMARK 4X12 TAN STRL LF (GAUZE/BANDAGES/DRESSINGS) ×1 IMPLANT
BNDG ESMARK 6X12 TAN STRL LF (GAUZE/BANDAGES/DRESSINGS) ×2 IMPLANT
CANISTER SUCT 1200ML W/VALVE (MISCELLANEOUS) ×2 IMPLANT
COVER WAND RF STERILE (DRAPES) ×2 IMPLANT
CUFF TURQT 42 SGL BLD (MISCELLANEOUS) ×1 IMPLANT
DRAPE FLUOR MINI C-ARM 54X84 (DRAPES) ×2 IMPLANT
DURAPREP 26ML APPLICATOR (WOUND CARE) ×2 IMPLANT
ELECT REM PT RETURN 9FT ADLT (ELECTROSURGICAL) ×2
ELECTRODE REM PT RTRN 9FT ADLT (ELECTROSURGICAL) ×1 IMPLANT
GAUZE PETRO XEROFOAM 1X8 (MISCELLANEOUS) ×2 IMPLANT
GAUZE SPONGE 4X4 12PLY STRL (GAUZE/BANDAGES/DRESSINGS) ×2 IMPLANT
GLOVE BIO SURGEON STRL SZ7.5 (GLOVE) ×2 IMPLANT
GLOVE INDICATOR 8.0 STRL GRN (GLOVE) ×2 IMPLANT
GOWN STRL REUS W/ TWL LRG LVL3 (GOWN DISPOSABLE) ×2 IMPLANT
GOWN STRL REUS W/TWL LRG LVL3 (GOWN DISPOSABLE) ×2
HANDLE YANKAUER SUCT BULB TIP (MISCELLANEOUS) ×2 IMPLANT
KIT TURNOVER KIT A (KITS) ×2 IMPLANT
LABEL OR SOLS (LABEL) ×2 IMPLANT
NDL FILTER BLUNT 18X1 1/2 (NEEDLE) ×1 IMPLANT
NDL HYPO 25X1 1.5 SAFETY (NEEDLE) ×3 IMPLANT
NDL MAYO CATGUT SZ5 (NEEDLE) ×1
NDL SUT 5 .5 CRC TPR PNT MAYO (NEEDLE) ×1 IMPLANT
NEEDLE FILTER BLUNT 18X 1/2SAF (NEEDLE) ×1
NEEDLE FILTER BLUNT 18X1 1/2 (NEEDLE) ×1 IMPLANT
NEEDLE HYPO 25X1 1.5 SAFETY (NEEDLE) ×6 IMPLANT
NS IRRIG 500ML POUR BTL (IV SOLUTION) ×2 IMPLANT
PACK EXTREMITY ARMC (MISCELLANEOUS) ×2 IMPLANT
PAD CAST CTTN 4X4 STRL (SOFTGOODS) ×1 IMPLANT
PADDING CAST COTTON 4X4 STRL (SOFTGOODS) ×1
RASP SM TEAR CROSS CUT (RASP) ×2 IMPLANT
SPLINT CAST 1 STEP 4X30 (MISCELLANEOUS) ×1 IMPLANT
SPLINT CAST 1 STEP 5X30 WHT (MISCELLANEOUS) ×1 IMPLANT
SPLINT FAST PLASTER 5X30 (CAST SUPPLIES)
SPLINT PLASTER CAST FAST 5X30 (CAST SUPPLIES) ×1 IMPLANT
SPONGE LAP 18X18 RF (DISPOSABLE) ×1 IMPLANT
STOCKINETTE M/LG 89821 (MISCELLANEOUS) ×2 IMPLANT
STRIP CLOSURE SKIN 1/2X4 (GAUZE/BANDAGES/DRESSINGS) ×2 IMPLANT
SUT MNCRL AB 4-0 PS2 18 (SUTURE) ×1 IMPLANT
SUT MNCRL+ 5-0 VIOLET P-3 (SUTURE) ×1 IMPLANT
SUT MONOCRYL 5-0 (SUTURE) ×1
SUT PDS AB 0 CT1 27 (SUTURE) IMPLANT
SUT VIC AB 0 SH 27 (SUTURE) ×2 IMPLANT
SUT VIC AB 2-0 SH 27 (SUTURE) ×2
SUT VIC AB 2-0 SH 27XBRD (SUTURE) ×2 IMPLANT
SUT VIC AB 3-0 SH 27 (SUTURE) ×1
SUT VIC AB 3-0 SH 27X BRD (SUTURE) ×1 IMPLANT
SUT VIC AB 4-0 FS2 27 (SUTURE) ×2 IMPLANT
SUT VICRYL AB 3-0 FS1 BRD 27IN (SUTURE) ×2 IMPLANT
SWABSTK COMLB BENZOIN TINCTURE (MISCELLANEOUS) ×2 IMPLANT
SYR 10ML LL (SYRINGE) ×4 IMPLANT
SYR 3ML LL SCALE MARK (SYRINGE) ×2 IMPLANT
WAND TOPAZ EPF  WAS Q (MISCELLANEOUS) ×1
WAND TOPAZ EPF WAS Q (MISCELLANEOUS) IMPLANT
WIRE MAGNUM (SUTURE) ×2 IMPLANT

## 2018-10-10 NOTE — Discharge Instructions (Addendum)
[facility] °[facility] ° °POST OPERATIVE INSTRUCTIONS FOR DR. TROXLER AND DR. FOWLER °[facility] ° ° °1. Take your medication as prescribed.  Pain medication should be taken only as needed. ° °2. Keep the dressing clean, dry and intact. ° °3. Keep your foot elevated above the heart level for the first 48 hours. ° °4. Walking to the bathroom and brief periods of walking are acceptable, unless we have instructed you to be non-weight bearing. ° °5. Always wear your post-op shoe when walking.  Always use your crutches if you are to be non-weight bearing. ° °6. Do not take a shower. Baths are permissible as long as the foot is kept out of the water.  ° °7. Every hour you are awake:  °- Bend your knee 15 times. ° °8. Call [facility] ([phone]) if any of the following problems occur: °- You develop a temperature or fever. °- The bandage becomes saturated with blood. °- Medication does not stop your pain. °- Injury of the foot occurs. °- Any symptoms of infection including redness, odor, or red streaks running from wound. ° ° ° °AMBULATORY SURGERY  °DISCHARGE INSTRUCTIONS ° ° °1) The drugs that you were given will stay in your system until tomorrow so for the next 24 hours you should not: ° °A) Drive an automobile °B) Make any legal decisions °C) Drink any alcoholic beverage ° ° °2) You may resume regular meals tomorrow.  Today it is better to start with liquids and gradually work up to solid foods. ° °You may eat anything you prefer, but it is better to start with liquids, then soup and crackers, and gradually work up to solid foods. ° ° °3) Please notify your doctor immediately if you have any unusual bleeding, trouble breathing, redness and pain at the surgery site, drainage, fever, or pain not relieved by medication. ° ° ° °4) Additional Instructions: ° ° ° ° ° ° ° °Please contact your physician with any problems or Same Day Surgery at [phone],  Monday through Friday 6 am to 4 pm, or [place] [facility] [place] Main number at [phone]. ° °

## 2018-10-10 NOTE — Anesthesia Preprocedure Evaluation (Signed)
Anesthesia Evaluation  Patient identified by MRN, date of birth, ID band Patient awake    Reviewed: Allergy & Precautions, H&P , NPO status , Patient's Chart, lab work & pertinent test results, reviewed documented beta blocker date and time   Airway Mallampati: II  TM Distance: >3 FB     Dental  (+) Chipped   Pulmonary asthma , former smoker,    Pulmonary exam normal        Cardiovascular Normal cardiovascular exam  Normal EF, normal cath 2017   Neuro/Psych PSYCHIATRIC DISORDERS Anxiety negative neurological ROS     GI/Hepatic hiatal hernia, GERD  Medicated and Controlled,  Endo/Other  Hypothyroidism   Renal/GU   negative genitourinary   Musculoskeletal  (+) Arthritis , Osteoarthritis,    Abdominal   Peds negative pediatric ROS (+)  Hematology   Anesthesia Other Findings   Reproductive/Obstetrics                             Anesthesia Physical  Anesthesia Plan  ASA: III  Anesthesia Plan: General ETT   Post-op Pain Management:    Induction:   PONV Risk Score and Plan: 3 and Ondansetron, Midazolam and Dexamethasone  Airway Management Planned: Oral ETT  Additional Equipment:   Intra-op Plan:   Post-operative Plan: Extubation in OR  Informed Consent: I have reviewed the patients History and Physical, chart, labs and discussed the procedure including the risks, benefits and alternatives for the proposed anesthesia with the patient or authorized representative who has indicated his/her understanding and acceptance.   Dental Advisory Given  Plan Discussed with: CRNA  Anesthesia Plan Comments:         Anesthesia Quick Evaluation

## 2018-10-10 NOTE — Anesthesia Procedure Notes (Signed)
Procedure Name: Intubation Date/Time: 10/10/2018 7:56 AM Performed by: Eben Burow, CRNA Pre-anesthesia Checklist: Patient identified, Emergency Drugs available, Suction available, Patient being monitored and Timeout performed Patient Re-evaluated:Patient Re-evaluated prior to induction Oxygen Delivery Method: Circle system utilized Preoxygenation: Pre-oxygenation with 100% oxygen Induction Type: IV induction Ventilation: Mask ventilation without difficulty Laryngoscope Size: Miller and 2 Grade View: Grade I Tube type: Oral Tube size: 7.5 mm Number of attempts: 1 Airway Equipment and Method: Stylet and LTA kit utilized Placement Confirmation: ETT inserted through vocal cords under direct vision,  positive ETCO2 and breath sounds checked- equal and bilateral Secured at: 22 cm Tube secured with: Tape Dental Injury: Teeth and Oropharynx as per pre-operative assessment

## 2018-10-10 NOTE — Transfer of Care (Signed)
Immediate Anesthesia Transfer of Care Note  Patient: Kaitlin Garner  Procedure(s) Performed: ACHILLES TENDON REPAIR-SECONDARY (Left Foot) PARTIAL CALCANECTOMY (Left Foot)  Patient Location: PACU  Anesthesia Type:General  Level of Consciousness: awake, alert , oriented and patient cooperative  Airway & Oxygen Therapy: Patient Spontanous Breathing and Patient connected to nasal cannula oxygen  Post-op Assessment: Report given to RN and Post -op Vital signs reviewed and stable  Post vital signs: Reviewed and stable  Last Vitals:  Vitals Value Taken Time  BP 125/87 10/10/2018  9:49 AM  Temp 36.2 C 10/10/2018  9:49 AM  Pulse 80 10/10/2018  9:49 AM  Resp 23 10/10/2018  9:49 AM  SpO2 97 % 10/10/2018  9:49 AM  Vitals shown include unvalidated device data.  Last Pain:  Vitals:   10/10/18 0658  TempSrc: Tympanic  PainSc: 0-No pain         Complications: No apparent anesthesia complications

## 2018-10-10 NOTE — Anesthesia Postprocedure Evaluation (Signed)
Anesthesia Post Note  Patient: Kaitlin Garner  Procedure(s) Performed: ACHILLES TENDON REPAIR-SECONDARY (Left Foot) PARTIAL CALCANECTOMY (Left Foot)  Patient location during evaluation: PACU Anesthesia Type: General Level of consciousness: awake and alert and oriented Pain management: pain level controlled Vital Signs Assessment: post-procedure vital signs reviewed and stable Respiratory status: spontaneous breathing Cardiovascular status: blood pressure returned to baseline Anesthetic complications: no     Last Vitals:  Vitals:   10/10/18 1100 10/10/18 1128  BP: 125/82 126/77  Pulse: 60 (!) 58  Resp: 14 16  Temp: 36.7 C   SpO2: 100% 99%    Last Pain:  Vitals:   10/10/18 1128  TempSrc:   PainSc: 6                  Avigdor Dollar

## 2018-10-10 NOTE — Anesthesia Post-op Follow-up Note (Signed)
Anesthesia QCDR form completed.        

## 2018-10-10 NOTE — H&P (Signed)
HISTORY AND PHYSICAL INTERVAL NOTE:  10/10/2018  7:26 AM  Kaitlin CarboLori L Garner  has presented today for surgery, with the diagnosis of Achilles tendinitis left lower extremity.  The various methods of treatment have been discussed with the patient.  No guarantees were given.  After consideration of risks, benefits and other options for treatment, the patient has consented to surgery.  I have reviewed the patients' chart and labs.     Garner history and physical examination was performed in my office.  The patient was reexamined.  There have been no changes to this history and physical examination.  Kaitlin Garner, Kaitlin Garner

## 2018-10-10 NOTE — Op Note (Signed)
Operative note   Surgeon:Blayklee Mable [employer] logistics/support/administrative officerowler    Assistant: None    Preop diagnosis: 1.  Achilles insertional tendinitis 2.  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  Debridement with partial Achilles tendon repair 2.  Calcaneal exostectomy left calcaneus 3.  Intraoperative use of fluoroscopy    EBL: Minimal    Anesthesia:local and general.  Local consisted of a one-to-one mixture of 0.25% bupivacaine with epinephrine and Exparel long-acting anesthetic.  Initially a total of 20 cc was used.  Remaining 10 mL's of Exparel was used at the end of the case.    Hemostasis: Epinephrine along incision site    Specimen: Exostosis left heel    Complications: Minimal    Operative indications:Kaitlin Garner is an 52 y.o. that presents today for surgical intervention.  The risks/benefits/alternatives/complications have been discussed and consent has been given.    Procedure:  Patient was brought into the OR and placed on the operating table in theprone position. After anesthesia was obtained theleft lower extremity was prepped and draped in usual sterile fashion.  Attention was directed to the posterior aspect of the left heel at the Achilles insertional site where longitudinal incision was performed.  Sharp and blunt dissection carried down to the peritenon.  Peritenon was then opened.  A tendon splitting incision was made just above its insertion on the posterior aspect of the calcaneus to the distal portion of the insertion.  The medial and lateral aspect of the Achilles tendon was removed from the posterior calcaneus.  A large prominent posterior calcaneal exostosis was noted.  With the use of a osteotome I was able to remove the prominent exostosis.  This was then smoothed with a power rasp.  Multiple views with fluoroscopy was used both prior to bone spur removal as well as after removal to reveal good removal of the exostosis.  The posterior aspect  of the heel was nice and smooth.  The wound was flushed with copious amounts of irrigation.  Layered closure was performed.  The tendon splitting incision was closed with a 3-0 Vicryl.  The superficial portion of the tendon was closed with a 3-0 Vicryl.  Next a Magnum wire was used in a Brunelle type of suturing on the distal aspect of the Achilles tendon.  A 4.5 mm bone anchor was used into the posterior aspect of the calcaneus and the tendon was brought and anchored to the posterior aspect of the heel.  The wound was flushed again with copious amounts of irrigation.  Subcutaneous tissue was then closed and peritenon closed with a 3-0 Vicryl.  Skin was closed with a 5-0 Monocryl.  Bulky sterile dressing was applied.  Patient was then placed in a posterior splint.    Patient tolerated the procedure and anesthesia well.  Was transported from the OR to the PACU with all vital signs stable and vascular status intact. To be discharged per routine protocol.  Will follow up in approximately 1 week in the outpatient clinic.

## 2018-10-10 NOTE — Progress Notes (Signed)
Informed Dr. Noralyn Pickarroll of patient complaint of itchy eyes and redness, 25mg  IV Benadryl ordered.

## 2018-10-14 LAB — SURGICAL PATHOLOGY

## 2019-01-07 ENCOUNTER — Other Ambulatory Visit: Payer: Self-pay | Admitting: Family Medicine

## 2019-01-07 DIAGNOSIS — Z1231 Encounter for screening mammogram for malignant neoplasm of breast: Secondary | ICD-10-CM

## 2019-01-19 ENCOUNTER — Ambulatory Visit
Admission: RE | Admit: 2019-01-19 | Discharge: 2019-01-19 | Disposition: A | Discharge status: Home / Self Care | Payer: BC Managed Care – PPO | Source: Ambulatory Visit | Attending: Family Medicine | Admitting: Family Medicine

## 2019-01-19 DIAGNOSIS — Z1231 Encounter for screening mammogram for malignant neoplasm of breast: Secondary | ICD-10-CM | POA: Insufficient documentation
